# Patient Record
Sex: Female | Born: 1985 | Race: White | Hispanic: No | Marital: Single | Smoking: Never smoker
Health system: Southern US, Community
[De-identification: ages and names within clinical notes are randomized; demographics above are authoritative.]

## PROBLEM LIST (undated history)

## (undated) ENCOUNTER — Inpatient Hospital Stay (HOSPITAL_COMMUNITY): Payer: Self-pay

## (undated) ENCOUNTER — Emergency Department (HOSPITAL_COMMUNITY): Discharge status: Absent without leave | Payer: Self-pay

## (undated) DIAGNOSIS — Z789 Other specified health status: Secondary | ICD-10-CM

## (undated) HISTORY — PX: INDUCED ABORTION: SHX677

## (undated) HISTORY — PX: DILATION AND CURETTAGE OF UTERUS: SHX78

---
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
## 2006-03-15 ENCOUNTER — Emergency Department (HOSPITAL_COMMUNITY): Admission: EM | Admit: 2006-03-15 | Discharge: 2006-03-15 | Payer: Self-pay | Admitting: Emergency Medicine

## 2006-11-04 ENCOUNTER — Emergency Department (HOSPITAL_COMMUNITY): Admission: EM | Admit: 2006-11-04 | Discharge: 2006-11-04 | Payer: Self-pay | Admitting: Family Medicine

## 2006-11-22 ENCOUNTER — Emergency Department (HOSPITAL_COMMUNITY): Admission: EM | Admit: 2006-11-22 | Discharge: 2006-11-22 | Payer: Self-pay | Admitting: Family Medicine

## 2007-01-08 ENCOUNTER — Inpatient Hospital Stay (HOSPITAL_COMMUNITY): Admission: AD | Admit: 2007-01-08 | Discharge: 2007-01-08 | Payer: Self-pay | Admitting: Obstetrics & Gynecology

## 2007-01-12 ENCOUNTER — Inpatient Hospital Stay (HOSPITAL_COMMUNITY): Admission: AD | Admit: 2007-01-12 | Discharge: 2007-01-12 | Payer: Self-pay | Admitting: Family Medicine

## 2007-01-14 ENCOUNTER — Inpatient Hospital Stay (HOSPITAL_COMMUNITY): Admission: AD | Admit: 2007-01-14 | Discharge: 2007-01-14 | Payer: Self-pay | Admitting: Obstetrics & Gynecology

## 2007-08-09 ENCOUNTER — Emergency Department (HOSPITAL_COMMUNITY): Admission: EM | Admit: 2007-08-09 | Discharge: 2007-08-09 | Payer: Self-pay | Admitting: Emergency Medicine

## 2007-08-27 ENCOUNTER — Emergency Department (HOSPITAL_COMMUNITY): Admission: EM | Admit: 2007-08-27 | Discharge: 2007-08-27 | Payer: Self-pay | Admitting: Family Medicine

## 2008-01-07 ENCOUNTER — Emergency Department (HOSPITAL_COMMUNITY): Admission: EM | Admit: 2008-01-07 | Discharge: 2008-01-07 | Payer: Self-pay | Admitting: Emergency Medicine

## 2008-03-27 ENCOUNTER — Emergency Department (HOSPITAL_COMMUNITY): Admission: EM | Admit: 2008-03-27 | Discharge: 2008-03-27 | Payer: Self-pay | Admitting: Emergency Medicine

## 2008-04-01 ENCOUNTER — Emergency Department (HOSPITAL_COMMUNITY): Admission: EM | Admit: 2008-04-01 | Discharge: 2008-04-01 | Payer: Self-pay | Admitting: Family Medicine

## 2008-04-17 ENCOUNTER — Emergency Department (HOSPITAL_COMMUNITY): Admission: EM | Admit: 2008-04-17 | Discharge: 2008-04-17 | Payer: Self-pay | Admitting: Emergency Medicine

## 2008-05-20 ENCOUNTER — Inpatient Hospital Stay (HOSPITAL_COMMUNITY): Admission: AD | Admit: 2008-05-20 | Discharge: 2008-05-21 | Payer: Self-pay | Admitting: Obstetrics & Gynecology

## 2008-08-27 ENCOUNTER — Emergency Department (HOSPITAL_COMMUNITY): Admission: EM | Admit: 2008-08-27 | Discharge: 2008-08-27 | Payer: Self-pay | Admitting: Family Medicine

## 2008-10-20 ENCOUNTER — Emergency Department (HOSPITAL_COMMUNITY): Admission: EM | Admit: 2008-10-20 | Discharge: 2008-10-20 | Payer: Self-pay | Admitting: Family Medicine

## 2008-11-13 ENCOUNTER — Inpatient Hospital Stay (HOSPITAL_COMMUNITY): Admission: AD | Admit: 2008-11-13 | Discharge: 2008-11-13 | Payer: Self-pay | Admitting: Obstetrics & Gynecology

## 2009-06-15 ENCOUNTER — Emergency Department (HOSPITAL_COMMUNITY): Admission: EM | Admit: 2009-06-15 | Discharge: 2009-06-15 | Payer: Self-pay | Admitting: Family Medicine

## 2009-11-19 ENCOUNTER — Emergency Department (HOSPITAL_COMMUNITY): Admission: EM | Admit: 2009-11-19 | Discharge: 2009-11-19 | Payer: Self-pay | Admitting: Family Medicine

## 2010-01-08 ENCOUNTER — Inpatient Hospital Stay (HOSPITAL_COMMUNITY): Admission: AD | Admit: 2010-01-08 | Discharge: 2010-01-08 | Payer: Self-pay | Admitting: Obstetrics and Gynecology

## 2010-01-14 ENCOUNTER — Inpatient Hospital Stay (HOSPITAL_COMMUNITY): Admission: AD | Admit: 2010-01-14 | Discharge: 2010-01-14 | Payer: Self-pay | Admitting: Obstetrics & Gynecology

## 2010-01-31 ENCOUNTER — Inpatient Hospital Stay (HOSPITAL_COMMUNITY): Admission: AD | Admit: 2010-01-31 | Discharge: 2010-01-31 | Payer: Self-pay | Admitting: Obstetrics and Gynecology

## 2010-02-03 ENCOUNTER — Ambulatory Visit: Payer: Self-pay | Admitting: Obstetrics and Gynecology

## 2010-02-03 ENCOUNTER — Inpatient Hospital Stay (HOSPITAL_COMMUNITY): Admission: AD | Admit: 2010-02-03 | Discharge: 2010-02-03 | Payer: Self-pay | Admitting: Obstetrics & Gynecology

## 2010-08-16 ENCOUNTER — Emergency Department (HOSPITAL_COMMUNITY): Admission: EM | Admit: 2010-08-16 | Discharge: 2010-08-16 | Payer: Self-pay | Admitting: Emergency Medicine

## 2010-11-12 ENCOUNTER — Inpatient Hospital Stay (HOSPITAL_COMMUNITY): Admission: AD | Admit: 2010-11-12 | Discharge: 2010-02-01 | Payer: Self-pay | Admitting: Obstetrics and Gynecology

## 2010-11-22 ENCOUNTER — Emergency Department (HOSPITAL_COMMUNITY)
Admission: EM | Admit: 2010-11-22 | Discharge: 2010-11-22 | Payer: Self-pay | Source: Home / Self Care | Admitting: Emergency Medicine

## 2010-12-21 ENCOUNTER — Emergency Department (HOSPITAL_COMMUNITY)
Admission: EM | Admit: 2010-12-21 | Discharge: 2010-12-21 | Payer: Self-pay | Source: Home / Self Care | Admitting: Emergency Medicine

## 2010-12-23 LAB — WET PREP, GENITAL
Trich, Wet Prep: NONE SEEN
Yeast Wet Prep HPF POC: NONE SEEN

## 2010-12-23 LAB — POCT URINALYSIS DIPSTICK
Bilirubin Urine: NEGATIVE
Hgb urine dipstick: NEGATIVE
Ketones, ur: NEGATIVE mg/dL
Nitrite: NEGATIVE
Protein, ur: NEGATIVE mg/dL
Specific Gravity, Urine: 1.015 (ref 1.005–1.030)
Urine Glucose, Fasting: NEGATIVE mg/dL
Urobilinogen, UA: 0.2 mg/dL (ref 0.0–1.0)
pH: 7 (ref 5.0–8.0)

## 2010-12-23 LAB — RPR: RPR Ser Ql: NONREACTIVE

## 2010-12-23 LAB — GC/CHLAMYDIA PROBE AMP, GENITAL
Chlamydia, DNA Probe: NEGATIVE
GC Probe Amp, Genital: NEGATIVE

## 2010-12-23 LAB — HIV ANTIBODY (ROUTINE TESTING W REFLEX): HIV: NONREACTIVE

## 2010-12-23 LAB — POCT PREGNANCY, URINE: Preg Test, Ur: NEGATIVE

## 2011-02-15 LAB — POCT URINALYSIS DIPSTICK
Bilirubin Urine: NEGATIVE
Glucose, UA: NEGATIVE mg/dL
Ketones, ur: NEGATIVE mg/dL
Nitrite: NEGATIVE
Protein, ur: NEGATIVE mg/dL
Specific Gravity, Urine: 1.015 (ref 1.005–1.030)
Urobilinogen, UA: 0.2 mg/dL (ref 0.0–1.0)
pH: 6 (ref 5.0–8.0)

## 2011-02-15 LAB — WET PREP, GENITAL
Trich, Wet Prep: NONE SEEN
Yeast Wet Prep HPF POC: NONE SEEN

## 2011-02-15 LAB — GC/CHLAMYDIA PROBE AMP, GENITAL
Chlamydia, DNA Probe: POSITIVE — AB
GC Probe Amp, Genital: POSITIVE — AB

## 2011-02-15 LAB — POCT PREGNANCY, URINE: Preg Test, Ur: NEGATIVE

## 2011-02-18 LAB — POCT RAPID STREP A (OFFICE): Streptococcus, Group A Screen (Direct): NEGATIVE

## 2011-02-24 LAB — URINALYSIS, ROUTINE W REFLEX MICROSCOPIC
Bilirubin Urine: NEGATIVE
Bilirubin Urine: NEGATIVE
Glucose, UA: NEGATIVE mg/dL
Glucose, UA: NEGATIVE mg/dL
Hgb urine dipstick: NEGATIVE
Hgb urine dipstick: NEGATIVE
Hgb urine dipstick: NEGATIVE
Ketones, ur: NEGATIVE mg/dL
Ketones, ur: NEGATIVE mg/dL
Leukocytes, UA: NEGATIVE
Nitrite: NEGATIVE
Nitrite: NEGATIVE
Protein, ur: 100 mg/dL — AB
Protein, ur: NEGATIVE mg/dL
Protein, ur: NEGATIVE mg/dL
Specific Gravity, Urine: 1.025 (ref 1.005–1.030)
Urobilinogen, UA: 0.2 mg/dL (ref 0.0–1.0)
Urobilinogen, UA: 0.2 mg/dL (ref 0.0–1.0)
pH: 6.5 (ref 5.0–8.0)

## 2011-02-24 LAB — URINE MICROSCOPIC-ADD ON

## 2011-02-24 LAB — POCT PREGNANCY, URINE: Preg Test, Ur: POSITIVE

## 2011-02-24 LAB — WET PREP, GENITAL
Clue Cells Wet Prep HPF POC: NONE SEEN
Trich, Wet Prep: NONE SEEN
Trich, Wet Prep: NONE SEEN
Trich, Wet Prep: NONE SEEN
Yeast Wet Prep HPF POC: NONE SEEN
Yeast Wet Prep HPF POC: NONE SEEN

## 2011-02-24 LAB — GC/CHLAMYDIA PROBE AMP, GENITAL
Chlamydia, DNA Probe: NEGATIVE
Chlamydia, DNA Probe: POSITIVE — AB
GC Probe Amp, Genital: NEGATIVE
GC Probe Amp, Genital: NEGATIVE
GC Probe Amp, Genital: NEGATIVE

## 2011-03-09 LAB — WET PREP, GENITAL: Yeast Wet Prep HPF POC: NONE SEEN

## 2011-03-09 LAB — GC/CHLAMYDIA PROBE AMP, GENITAL
Chlamydia, DNA Probe: POSITIVE — AB
GC Probe Amp, Genital: NEGATIVE

## 2011-03-09 LAB — POCT URINALYSIS DIP (DEVICE)
Glucose, UA: NEGATIVE mg/dL
Hgb urine dipstick: NEGATIVE
Nitrite: NEGATIVE
Urobilinogen, UA: 0.2 mg/dL (ref 0.0–1.0)

## 2011-03-14 LAB — POCT URINALYSIS DIP (DEVICE)
Bilirubin Urine: NEGATIVE
Ketones, ur: NEGATIVE mg/dL

## 2011-03-14 LAB — URINE CULTURE

## 2011-03-14 LAB — POCT PREGNANCY, URINE: Preg Test, Ur: NEGATIVE

## 2011-03-31 ENCOUNTER — Inpatient Hospital Stay (HOSPITAL_COMMUNITY)
Admission: AD | Admit: 2011-03-31 | Discharge: 2011-03-31 | Disposition: A | Discharge status: Home / Self Care | Payer: Self-pay | Source: Ambulatory Visit | Attending: Obstetrics & Gynecology | Admitting: Obstetrics & Gynecology

## 2011-03-31 DIAGNOSIS — N949 Unspecified condition associated with female genital organs and menstrual cycle: Secondary | ICD-10-CM | POA: Insufficient documentation

## 2011-03-31 DIAGNOSIS — R109 Unspecified abdominal pain: Secondary | ICD-10-CM | POA: Insufficient documentation

## 2011-03-31 LAB — URINALYSIS, ROUTINE W REFLEX MICROSCOPIC
Glucose, UA: NEGATIVE mg/dL
Specific Gravity, Urine: >1.03 — ABNORMAL HIGH (ref 1.005–1.030)

## 2011-03-31 LAB — URINE MICROSCOPIC-ADD ON

## 2011-03-31 LAB — POCT PREGNANCY, URINE: Preg Test, Ur: NEGATIVE

## 2011-04-23 NOTE — H&P (Signed)
NAME:  Madison [name], Madison [name]                       ACCOUNT NO.:  [id number]   MEDICAL RECORD NO.:  [id number]                   PATIENT TYPE:  AMB   LOCATION:  SDC                                  FACILITY:  [facility]   PHYSICIAN:  [place] B. Earlene Plater, M.D.               DATE OF BIRTH:  1986/02/22   DATE OF ADMISSION:  01/20/2004  DATE OF DISCHARGE:                                HISTORY & PHYSICAL   PREOPERATIVE DIAGNOSIS:  Possible molar pregnancy.   INTENDED PROCEDURE:  Suction curettage with ultrasound guidance.   HISTORY OF PRESENT ILLNESS:  A 25 year old white female who initially  presented to Urgent Care with clotting, nausea, and vomiting.  Was found to  have a positive pregnancy test and a quantitative HCG of approximately  79,000.  Had an ultrasound at [facility] on January 15, 2004,  which showed a markedly thickened endometrium but no IUP or evidence of  ectopic.  Repeat HCG approximately one day later at the referring  physician's office showed no change.  The patient presented to my office  approximately four days later, and a recheck HCG was unchanged, still in the  79,000 range.  Given this persistently high HCG with only a thickened  endometrium, suspicion is high for a molar pregnancy.  The patient presents  for suction curettage.   PAST MEDICAL HISTORY:  None.   PAST SURGICAL HISTORY:  None.   MEDICATIONS:  Phenergan.   ALLERGIES:  None.   FAMILY HISTORY:  Noncontributory.   REVIEW OF SYSTEMS:  Otherwise negative.   PHYSICAL EXAMINATION:  VITAL SIGNS:  Blood pressure 110/70, weight 115,  height 5 feet 5 inches.  GENERAL:  Alert and oriented, in no acute distress.  SKIN: Warm and dry, no lesions.  CARDIAC:  Regular rate and rhythm.  CHEST:  Lungs clear to auscultation.  ABDOMEN:  Liver and spleen normal, no hernia.  PELVIC:  Normal external genitalia.  Vagina and cervix normal.  Uterus is  normal size to slightly enlarged and nontender.  No adnexal  masses or  tenderness noted.   ASSESSMENT:  Probable molar or partial molar pregnancy.   PLAN:  Suction curettage.  Operative risks discussed, including infection,  bleeding, uterine perforation, damage to surrounding organs.  Will use  ultrasound guidance to improve complete evacuation and reduce perforation  risk.  All questions answered.  Patient wishes to proceed.                                               Gerri Spore B. Earlene Plater, M.D.    WBD/MEDQ  D:  01/20/2004  T:  01/20/2004  Job:  [id number]   cc:   Brett Canales A. Cleta Alberts, M.D.  [address]  [place]  [place] [address]  Fax: [phone]

## 2011-04-23 NOTE — Op Note (Signed)
NAME:  Madison [name], Madison [name]                       ACCOUNT NO.:  [id number]   MEDICAL RECORD NO.:  [id number]                   PATIENT TYPE:  AMB   LOCATION:  SDC                                  [facility]   PHYSICIAN:  [place] B. Earlene Plater, M.D.               DATE OF BIRTH:  10-03-1986   DATE OF PROCEDURE:  01/20/2004  DATE OF DISCHARGE:                                 OPERATIVE REPORT   PREOPERATIVE DIAGNOSIS:  Missed abortion questionable molar pregnancy.   POSTOPERATIVE DIAGNOSIS:  Missed abortion questionable molar pregnancy.   PROCEDURE:  Suction curettage with ultrasound assistance.   SURGEON:  Chester Holstein. Earlene Plater, M.D.   ANESTHESIA:  MAC and 20 mL of 0.25% Nesacaine paracervical block.   ESTIMATED BLOOD LOSS:  Less than 50 mL.   COMPLICATIONS:  None.   INDICATIONS FOR PROCEDURE:  Patient with a persistently elevated HCG in the  80,000 range with heterogeneously thickened endometrium, no adnexal masses.   DESCRIPTION OF PROCEDURE:  The patient was taken to the operating room and  MAC obtained.  She was placed in the [place] stirrups and prepped and draped  in the standard fashion.  Her bladder was emptied with a red rubber  catheter.  The speculum was inserted.  Paracervical block was placed in a  standard fashion.  A single tooth was placed on the anterior lip of the  cervix and the cervix easily dilated to a #21.  A #7 curved cannula was  inserted and with suction on, a moderate amount of products returned.  It  took 4-5 passes to completely clear the uterus.  The uterus was gently  curetted and a gritty texture noted.  There was not evidence of any retained  tissue by ultrasound.  Therefore, the procedure was terminated.  The  speculum was removed, the cervix was hemostatic.  The patient was taken to  the recovery room awake, alert, and in stable condition.  There were no  complications and she tolerated the procedure well.     Gerri Spore B. Earlene Plater, M.D.    WBD/MEDQ  D:  01/20/2004  T:  01/20/2004  Job:  [id number]

## 2011-04-28 ENCOUNTER — Inpatient Hospital Stay (INDEPENDENT_AMBULATORY_CARE_PROVIDER_SITE_OTHER)
Admission: RE | Admit: 2011-04-28 | Discharge: 2011-04-28 | Disposition: A | Discharge status: Home / Self Care | Payer: Self-pay | Source: Ambulatory Visit | Attending: Family Medicine | Admitting: Family Medicine

## 2011-04-28 DIAGNOSIS — N390 Urinary tract infection, site not specified: Secondary | ICD-10-CM

## 2011-04-28 LAB — POCT URINALYSIS DIP (DEVICE)
Protein, ur: NEGATIVE mg/dL
Specific Gravity, Urine: <=1.005 (ref 1.005–1.030)
Urobilinogen, UA: 0.2 mg/dL (ref 0.0–1.0)

## 2011-04-28 LAB — WET PREP, GENITAL
Trich, Wet Prep: NONE SEEN
Yeast Wet Prep HPF POC: NONE SEEN

## 2011-04-28 LAB — POCT PREGNANCY, URINE: Preg Test, Ur: NEGATIVE

## 2011-04-29 LAB — URINE CULTURE
Colony Count: NO GROWTH
Culture  Setup Time: 201205231720
Culture: NO GROWTH

## 2011-04-30 LAB — GC/CHLAMYDIA PROBE AMP, GENITAL
Chlamydia, DNA Probe: POSITIVE — AB
GC Probe Amp, Genital: NEGATIVE

## 2011-07-01 ENCOUNTER — Encounter (HOSPITAL_COMMUNITY): Payer: Self-pay | Admitting: *Deleted

## 2011-07-01 ENCOUNTER — Inpatient Hospital Stay (HOSPITAL_COMMUNITY)
Admission: AD | Admit: 2011-07-01 | Discharge: 2011-07-01 | Disposition: A | Discharge status: Home / Self Care | Payer: Self-pay | Source: Ambulatory Visit | Attending: Obstetrics & Gynecology | Admitting: Obstetrics & Gynecology

## 2011-07-01 DIAGNOSIS — A499 Bacterial infection, unspecified: Secondary | ICD-10-CM

## 2011-07-01 DIAGNOSIS — B9689 Other specified bacterial agents as the cause of diseases classified elsewhere: Secondary | ICD-10-CM | POA: Insufficient documentation

## 2011-07-01 DIAGNOSIS — N898 Other specified noninflammatory disorders of vagina: Secondary | ICD-10-CM

## 2011-07-01 DIAGNOSIS — N949 Unspecified condition associated with female genital organs and menstrual cycle: Secondary | ICD-10-CM | POA: Insufficient documentation

## 2011-07-01 DIAGNOSIS — N938 Other specified abnormal uterine and vaginal bleeding: Secondary | ICD-10-CM | POA: Insufficient documentation

## 2011-07-01 DIAGNOSIS — N939 Abnormal uterine and vaginal bleeding, unspecified: Secondary | ICD-10-CM

## 2011-07-01 DIAGNOSIS — R109 Unspecified abdominal pain: Secondary | ICD-10-CM | POA: Insufficient documentation

## 2011-07-01 DIAGNOSIS — N760 Acute vaginitis: Secondary | ICD-10-CM | POA: Insufficient documentation

## 2011-07-01 HISTORY — DX: Other specified health status: Z78.9

## 2011-07-01 LAB — URINALYSIS, ROUTINE W REFLEX MICROSCOPIC
Bilirubin Urine: NEGATIVE
Glucose, UA: NEGATIVE mg/dL
Specific Gravity, Urine: >1.03 — ABNORMAL HIGH (ref 1.005–1.030)
Urobilinogen, UA: 0.2 mg/dL (ref 0.0–1.0)

## 2011-07-01 LAB — URINE MICROSCOPIC-ADD ON

## 2011-07-01 LAB — CBC
MCH: 32.8 pg (ref 26.0–34.0)
Platelets: 221 10*3/uL (ref 150–400)
RBC: 4.42 MIL/uL (ref 3.87–5.11)

## 2011-07-01 LAB — DIFFERENTIAL
Basophils Relative: 1 % (ref 0–1)
Eosinophils Absolute: 0.1 10*3/uL (ref 0.0–0.7)
Lymphs Abs: 1.3 10*3/uL (ref 0.7–4.0)
Neutro Abs: 1.5 10*3/uL — ABNORMAL LOW (ref 1.7–7.7)
Neutrophils Relative %: 47 % (ref 43–77)

## 2011-07-01 LAB — WET PREP, GENITAL: Trich, Wet Prep: NONE SEEN

## 2011-07-01 MED ORDER — METRONIDAZOLE 500 MG PO TABS: 500.0000 mg | ORAL_TABLET | Freq: Three times a day (TID) | ORAL | Status: AC

## 2011-07-01 NOTE — ED Notes (Signed)
Pt left without informing staff, called pt's phone # to inform her a prescription was sent to her pharmacy & that she needs to return for BV instructions.  Pt did not answer, attempted to leave voicemail but mailbox was full.

## 2011-07-01 NOTE — ED Provider Notes (Signed)
History    Madison [name] presents with vaginal discharge and abdominal cramping that started a week ago. Recently has a new sex partner, last sexual intercourse was yesterday. Never had a pap smear. Hx of Chlamydia with last sex partner. Uses condoms for birth control sometimes. Discharge is described as pink to brown. LMP 06/23/11 and was normal.   Chief Complaint  Patient presents with  . Abdominal Cramping   Patient is a 25 y.o. female presenting with cramps. The history is provided by the patient.  Abdominal Cramping The primary symptoms of the illness include abdominal pain, nausea and vaginal discharge. The primary symptoms of the illness do not include fever, fatigue, vomiting, diarrhea or dysuria. The onset of the illness was gradual. The problem has been gradually worsening.  The vaginal discharge is not associated with dysuria.  Symptoms associated with the illness do not include chills, diaphoresis, constipation, frequency or back pain.    Past Medical History  Diagnosis Date  . No pertinent past medical history     Past Surgical History  Procedure Date  . Dilation and curettage of uterus     No family history on file.  History  Substance Use Topics  . Smoking status: Never Smoker   . Smokeless tobacco: Not on file  . Alcohol Use: Yes     on weekends    OB History    Grav Para Term Preterm Abortions TAB SAB Ect Mult Living   3    3  3          Review of Systems  Constitutional: Negative for fever, chills, diaphoresis and fatigue.  HENT: Negative for ear pain, congestion, sore throat, facial swelling, neck pain, neck stiffness, dental problem and sinus pressure.   Eyes: Negative for photophobia, pain and discharge.  Respiratory: Negative for cough, chest tightness and wheezing.   Cardiovascular: Negative.   Gastrointestinal: Positive for nausea and abdominal pain. Negative for vomiting, diarrhea, constipation and abdominal distention.  Genitourinary:  Positive for vaginal discharge. Negative for dysuria, frequency, flank pain and difficulty urinating.  Musculoskeletal: Negative for myalgias, back pain and gait problem.  Skin: Negative.  Negative for color change and rash.  Neurological: Negative for dizziness, speech difficulty, weakness, light-headedness, numbness and headaches.  Psychiatric/Behavioral: Negative for confusion and agitation.    Physical Exam  BP 104/66  Pulse 69  Temp(Src) 98.7 F (37.1 C) (Oral)  Resp 16  Ht 5' 6.5" (1.689 m)  Wt 141 lb 12.8 oz (64.32 kg)  BMI 22.54 kg/m2  SpO2 99%  LMP 06/12/2011  Physical Exam  Nursing note and vitals reviewed. Constitutional: She is oriented to person, place, and time. She appears well-developed and well-nourished.  HENT:  Head: Normocephalic and atraumatic.  Eyes: EOM are normal.  Neck: Neck supple.  Pulmonary/Chest: Effort normal.  Abdominal: Soft. There is no tenderness.  Genitourinary: Uterus normal. There is no lesion on the right labia. There is no lesion on the left labia. Cervix exhibits motion tenderness. Right adnexum displays no tenderness. Left adnexum displays no tenderness. Vaginal discharge found.       Cervical motion is mild. Small amount of blood vaginal vault that is malodorous.   Musculoskeletal: Normal range of motion. She exhibits no edema.  Neurological: She is alert and oriented to person, place, and time. No cranial nerve deficit.  Skin: Skin is warm and dry.    ED Course  Procedures Cultures for GC and Chlamydia obtained. CBC with diff ordered.   MDM     .  [place], [place] 07/08/11 [phone]

## 2011-07-01 NOTE — Progress Notes (Signed)
By H Neese NP 

## 2011-07-01 NOTE — Progress Notes (Signed)
Pt states she has been having abdominal cramping for about one week. Has been having a brown d/c with an odor.

## 2011-07-01 NOTE — Progress Notes (Signed)
Lower abd cramping x 1 week, brown discharge also x 1 week, now notices odor.

## 2011-07-01 NOTE — ED Notes (Signed)
Pt returned for DC instructions.

## 2011-07-01 NOTE — ED Notes (Signed)
Pt called back, informed pt a prescription was sent to her pharmacy & that she needs to return for instructions.  Pt states she had to leave for an interview @ 1400 & that she will return after the interview.

## 2011-07-02 LAB — GC/CHLAMYDIA PROBE AMP, GENITAL: GC Probe Amp, Genital: NEGATIVE

## 2011-07-04 NOTE — ED Notes (Signed)
Chlamydia positive. Called pt to give result, no answer, left message to call back.

## 2011-07-05 ENCOUNTER — Telehealth (HOSPITAL_COMMUNITY): Payer: Self-pay | Admitting: *Deleted

## 2011-07-05 NOTE — Telephone Encounter (Signed)
Patient called in to get her lab results.  Name, dob and social verified.  Instructed patient of positive chlamydia culture.  Instructed patient to schedule treatment with [facility] ST[facility] clinic at [phone].  Instructed patient to notify her partner for treatment.  Report faxed to health department.

## 2011-08-27 LAB — POCT URINALYSIS DIP (DEVICE)
Nitrite: NEGATIVE
Protein, ur: >=300 — AB
pH: 5.5

## 2011-08-27 LAB — POCT PREGNANCY, URINE
Operator id: 235561
Preg Test, Ur: NEGATIVE

## 2011-08-31 LAB — GC/CHLAMYDIA PROBE AMP, GENITAL
Chlamydia, DNA Probe: POSITIVE — AB
GC Probe Amp, Genital: NEGATIVE

## 2011-08-31 LAB — POCT URINALYSIS DIP (DEVICE)
Hgb urine dipstick: NEGATIVE
Protein, ur: >=300 — AB
Specific Gravity, Urine: 1.025
Urobilinogen, UA: 0.2
pH: 6

## 2011-09-02 LAB — URINALYSIS, ROUTINE W REFLEX MICROSCOPIC
Bilirubin Urine: NEGATIVE
Nitrite: NEGATIVE
Specific Gravity, Urine: >1.03 — ABNORMAL HIGH
Urobilinogen, UA: 0.2

## 2011-09-02 LAB — POCT PREGNANCY, URINE: Preg Test, Ur: NEGATIVE

## 2011-09-02 LAB — WET PREP, GENITAL: Trich, Wet Prep: NONE SEEN

## 2011-09-02 LAB — URINE MICROSCOPIC-ADD ON

## 2011-09-02 LAB — GC/CHLAMYDIA PROBE AMP, GENITAL: Chlamydia, DNA Probe: NEGATIVE

## 2011-09-06 LAB — URINE CULTURE: Colony Count: >=100000

## 2011-09-06 LAB — POCT URINALYSIS DIP (DEVICE)
Bilirubin Urine: NEGATIVE
Glucose, UA: NEGATIVE
Hgb urine dipstick: NEGATIVE
Ketones, ur: NEGATIVE
Nitrite: NEGATIVE
Operator id: 239701
Protein, ur: NEGATIVE
Specific Gravity, Urine: 1.01
Urobilinogen, UA: 1
pH: 6

## 2011-09-07 LAB — POCT URINALYSIS DIP (DEVICE)
Nitrite: NEGATIVE
Protein, ur: >=300 mg/dL — AB
Urobilinogen, UA: 0.2 mg/dL (ref 0.0–1.0)
pH: 5.5 (ref 5.0–8.0)

## 2011-09-07 LAB — POCT PREGNANCY, URINE: Preg Test, Ur: NEGATIVE

## 2011-09-07 LAB — WET PREP, GENITAL

## 2011-09-10 LAB — URINE MICROSCOPIC-ADD ON

## 2011-09-10 LAB — URINALYSIS, ROUTINE W REFLEX MICROSCOPIC
Glucose, UA: NEGATIVE mg/dL
Leukocytes, UA: NEGATIVE
Nitrite: POSITIVE — AB
pH: 6 (ref 5.0–8.0)

## 2011-09-10 LAB — GC/CHLAMYDIA PROBE AMP, GENITAL
Chlamydia, DNA Probe: NEGATIVE
GC Probe Amp, Genital: NEGATIVE

## 2011-09-10 LAB — WET PREP, GENITAL
Clue Cells Wet Prep HPF POC: NONE SEEN
Yeast Wet Prep HPF POC: NONE SEEN

## 2011-09-17 LAB — POCT URINALYSIS DIP (DEVICE)
Glucose, UA: NEGATIVE
Ketones, ur: NEGATIVE
Operator id: 239701
Specific Gravity, Urine: 1.025

## 2011-10-07 NOTE — ED Provider Notes (Signed)
Attestation of Attending Supervision of Advanced Practitioner: Evaluation and management procedures were performed by the PA/NP/CNM/OB Fellow under my supervision/collaboration. Chart reviewed and agree with management and plan.  ANYANWU,UGONNA A M.D. 10/07/2011 2:51 PM   

## 2011-10-08 ENCOUNTER — Inpatient Hospital Stay (INDEPENDENT_AMBULATORY_CARE_PROVIDER_SITE_OTHER)
Admission: RE | Admit: 2011-10-08 | Discharge: 2011-10-08 | Disposition: A | Discharge status: Home / Self Care | Payer: Self-pay | Source: Ambulatory Visit | Attending: Family Medicine | Admitting: Family Medicine

## 2011-10-08 DIAGNOSIS — N760 Acute vaginitis: Secondary | ICD-10-CM

## 2011-10-08 LAB — POCT URINALYSIS DIP (DEVICE)
Bilirubin Urine: NEGATIVE
Bilirubin Urine: NEGATIVE
Glucose, UA: NEGATIVE mg/dL
Glucose, UA: NEGATIVE mg/dL
Ketones, ur: NEGATIVE mg/dL
Specific Gravity, Urine: 1.025 (ref 1.005–1.030)
Specific Gravity, Urine: 1.02 (ref 1.005–1.030)
pH: 7 (ref 5.0–8.0)

## 2011-10-08 LAB — WET PREP, GENITAL: Trich, Wet Prep: NONE SEEN

## 2011-10-11 LAB — GC/CHLAMYDIA PROBE AMP, GENITAL
Chlamydia, DNA Probe: NEGATIVE
GC Probe Amp, Genital: POSITIVE — AB

## 2011-10-12 NOTE — ED Notes (Signed)
Pt. Called back and verified x2. Pt. Given results and told to come back for injection between 1130-1800.Pt. Told she was adeq. Treated with Flagyl for BV.  Pt. Instructed to notify her partner, no sex for 1 week and to practice safe sex. Pt. Told to get HIV testing at the [facility] STD clinic.

## 2011-10-12 NOTE — ED Notes (Signed)
GC pos., Chlamydia neg., wet prep: Many clue cells, few WBC's.  Pt. adequately treated with Metronidazole.  Labs shown to [facility] and order obtained for Rocephin 250 mg. IM.  I called pt. and left message to call.

## 2011-10-14 ENCOUNTER — Telehealth (HOSPITAL_COMMUNITY): Payer: Self-pay | Admitting: *Deleted

## 2011-10-19 ENCOUNTER — Telehealth (HOSPITAL_COMMUNITY): Payer: Self-pay | Admitting: *Deleted

## 2011-10-19 NOTE — ED Notes (Addendum)
11/11 Sheffield Slider RN documented on Pending lab results F/U form that pt. came in to get a work note to cover her from 11/2 visit. She explained that she needs to get the injection. Pt. left.

## 2011-10-20 ENCOUNTER — Telehealth (HOSPITAL_COMMUNITY): Payer: Self-pay | Admitting: *Deleted

## 2011-10-22 ENCOUNTER — Telehealth (HOSPITAL_COMMUNITY): Payer: Self-pay | Admitting: *Deleted

## 2011-11-02 ENCOUNTER — Telehealth (HOSPITAL_COMMUNITY): Payer: Self-pay | Admitting: *Deleted

## 2011-11-05 ENCOUNTER — Encounter (HOSPITAL_COMMUNITY): Payer: Self-pay | Admitting: *Deleted

## 2011-11-05 ENCOUNTER — Emergency Department (HOSPITAL_COMMUNITY)
Admission: EM | Admit: 2011-11-05 | Discharge: 2011-11-05 | Disposition: A | Discharge status: Home / Self Care | Payer: Self-pay | Source: Home / Self Care

## 2011-11-05 ENCOUNTER — Telehealth (HOSPITAL_COMMUNITY): Payer: Self-pay | Admitting: *Deleted

## 2011-11-05 MED ORDER — CEFTRIAXONE SODIUM 250 MG IJ SOLR
250.0000 mg | Freq: Once | INTRAMUSCULAR | Status: AC
  Administered 2011-11-05: 250 mg via INTRAMUSCULAR

## 2011-11-05 MED ORDER — CEFTRIAXONE SODIUM 250 MG IJ SOLR
INTRAMUSCULAR | Status: AC
  Filled 2011-11-05: qty 250

## 2011-11-05 MED ORDER — LIDOCAINE HCL (PF) 1 % IJ SOLN
INTRAMUSCULAR | Status: AC
  Filled 2011-11-05: qty 5

## 2011-11-05 NOTE — ED Notes (Signed)
Pt. here for Rocephin injection for GC diagnosed 10/12/2011

## 2011-11-05 NOTE — ED Notes (Signed)
DHHS form completed showing pt. has been treated and faxed to the [facility].  I called Brandi Sessoms at the [facility] and left a message that pt. came today for treatment. Vassie Moselle 11/05/2011

## 2011-12-29 ENCOUNTER — Encounter (HOSPITAL_COMMUNITY): Payer: Self-pay

## 2011-12-29 ENCOUNTER — Emergency Department (INDEPENDENT_AMBULATORY_CARE_PROVIDER_SITE_OTHER)
Admission: EM | Admit: 2011-12-29 | Discharge: 2011-12-29 | Disposition: A | Discharge status: Home / Self Care | Payer: Self-pay | Source: Home / Self Care

## 2011-12-29 DIAGNOSIS — Z711 Person with feared health complaint in whom no diagnosis is made: Secondary | ICD-10-CM

## 2011-12-29 LAB — POCT URINALYSIS DIP (DEVICE)
Leukocytes, UA: NEGATIVE
Nitrite: NEGATIVE
Protein, ur: 30 mg/dL — AB
Specific Gravity, Urine: 1.025 (ref 1.005–1.030)
Urobilinogen, UA: 2 mg/dL — ABNORMAL HIGH (ref 0.0–1.0)

## 2011-12-29 LAB — WET PREP, GENITAL

## 2011-12-29 MED ORDER — CEFTRIAXONE SODIUM 250 MG IJ SOLR
INTRAMUSCULAR | Status: AC
  Filled 2011-12-29: qty 250

## 2011-12-29 MED ORDER — LIDOCAINE HCL (PF) 1 % IJ SOLN
INTRAMUSCULAR | Status: AC
  Filled 2011-12-29: qty 5

## 2011-12-29 MED ORDER — CEFTRIAXONE SODIUM 250 MG IJ SOLR
250.0000 mg | Freq: Once | INTRAMUSCULAR | Status: AC
  Administered 2011-12-29: 250 mg via INTRAMUSCULAR

## 2011-12-29 MED ORDER — AZITHROMYCIN 250 MG PO TABS
1000.0000 mg | ORAL_TABLET | Freq: Every day | ORAL | Status: DC
  Administered 2011-12-29: 1000 mg via ORAL

## 2011-12-29 MED ORDER — AZITHROMYCIN 250 MG PO TABS
ORAL_TABLET | ORAL | Status: AC
  Filled 2011-12-29: qty 4

## 2011-12-29 NOTE — ED Provider Notes (Signed)
Medical screening examination/treatment/procedure(s) were performed by non-physician practitioner and as supervising physician I was immediately available for consultation/collaboration.  Lynsie Mcwatters M. MD   Dailey Buccheri M Shekela Goodridge, MD 12/29/11 1812 

## 2011-12-29 NOTE — ED Provider Notes (Signed)
History     CSN: [id number]  Arrival date & time 12/29/11  1110   None     Chief Complaint  Patient presents with  . SEXUALLY TRANSMITTED DISEASE    (Consider location/radiation/quality/duration/timing/severity/associated sxs/prior treatment) HPI Comments: Patient presents today requesting he be treated for gonorrhea. She states her boyfriend was recently tested positive for gonorrhea. She's had a brownish discharge and odor for the last 3 weeks. She admits to having irregular menses and her last normal menstrual period was 2 months ago. She states that she was treated for gonorrhea 2 weeks ago [facility]. She was treated with intramuscular injection and pills but she does not know what these were. Her boyfriend was not treated at the same time as she was. He is getting treated today also.    Past Medical History  Diagnosis Date  . No pertinent past medical history     Past Surgical History  Procedure Date  . Dilation and curettage of uterus     Family History  Problem Relation Age of Onset  . Parkinsonism Other   . Diabetes Other   . Schizophrenia Other     History  Substance Use Topics  . Smoking status: Never Smoker   . Smokeless tobacco: Not on file  . Alcohol Use: Yes     on weekends    OB History    Grav Para Term Preterm Abortions TAB SAB Ect Mult Living   3    3  3          Review of Systems  Gastrointestinal: Negative for nausea, vomiting and abdominal pain.  Genitourinary: Positive for vaginal discharge. Negative for dysuria, frequency, vaginal bleeding, vaginal pain and pelvic pain.    Allergies  Review of patient's allergies indicates no known allergies.  Home Medications  No current outpatient prescriptions on file.  There were no vitals taken for this visit.  Physical Exam  Nursing note and vitals reviewed. Constitutional: She appears well-developed and well-nourished. No distress.  Cardiovascular: Normal rate, regular rhythm  and normal heart sounds.   Pulmonary/Chest: Effort normal and breath sounds normal. No respiratory distress.  Abdominal: Soft. She exhibits no distension and no mass. There is no tenderness.  Genitourinary: Uterus normal. There is no rash, tenderness or lesion on the right labia. There is no rash, tenderness or lesion on the left labia. Cervix exhibits no motion tenderness, no discharge and no friability. Right adnexum displays no mass, no tenderness and no fullness. Left adnexum displays no mass, no tenderness and no fullness. There is bleeding (small amount of brownish spotting noted) around the vagina. No erythema or tenderness around the vagina. No foreign body around the vagina. No signs of injury around the vagina. No vaginal discharge found.  Neurological: She is alert.  Skin: Skin is warm and dry.  Psychiatric: She has a normal mood and affect.    ED Course  Procedures (including critical care time)  Labs Reviewed  POCT URINALYSIS DIP (DEVICE) - Abnormal; Notable for the following:    Hgb urine dipstick MODERATE (*)    Protein, ur 30 (*)    Urobilinogen, UA 2.0 (*)    All other components within normal limits  POCT PREGNANCY, URINE  GC/CHLAMYDIA PROBE AMP, GENITAL  WET PREP, GENITAL  POCT PREGNANCY, URINE   No results found.   No diagnosis found.    MDM  GC/Chlamydia & Wet prep pending. Urine preg neg.        Melody Comas,  PA 12/29/11 1230

## 2011-12-29 NOTE — ED Notes (Addendum)
States she has GC and needs treatment ; has been having syx for 2 weeks, prior tx w metronidazole; partner her for tx ; per pt, she [facility] for her syx f/u in November; LMP was brief, feels as if her menses is trying to come on

## 2011-12-30 LAB — GC/CHLAMYDIA PROBE AMP, GENITAL: GC Probe Amp, Genital: POSITIVE — AB

## 2011-12-31 ENCOUNTER — Telehealth (HOSPITAL_COMMUNITY): Payer: Self-pay | Admitting: *Deleted

## 2011-12-31 NOTE — ED Notes (Signed)
GC pos., Chlamydia neg. Wet prep: Few clue cells, mod. WBC's.  Pt. adequately treated with Rocephin and Zithromax. Pt. verified with DOB and SS #. Pt. unable to give correct address. i told her the address we have.  Pt. states its her grandmothers address she gives because she moves around a lot. Pt. given results and told she was adequately treated. Pt. instructed to notify her partner, no sex for 1 week and to practice safe sex.  Pt. told she can get HIV testing at the [facility].  Pt. voiced understanding. Vassie Moselle 12/31/2011

## 2011-12-31 NOTE — ED Notes (Signed)
DHHS form completed and faxed to [facility]. Madison [name] 12/31/2011

## 2012-03-29 ENCOUNTER — Emergency Department (INDEPENDENT_AMBULATORY_CARE_PROVIDER_SITE_OTHER)
Admission: EM | Admit: 2012-03-29 | Discharge: 2012-03-29 | Disposition: A | Discharge status: Nursing Home (Includes ICF) | Payer: Self-pay | Source: Home / Self Care | Attending: Family Medicine | Admitting: Family Medicine

## 2012-03-29 ENCOUNTER — Inpatient Hospital Stay (HOSPITAL_COMMUNITY): Payer: Self-pay

## 2012-03-29 ENCOUNTER — Inpatient Hospital Stay (HOSPITAL_COMMUNITY)
Admission: AD | Admit: 2012-03-29 | Discharge: 2012-03-29 | Disposition: A | Discharge status: Home / Self Care | Payer: Self-pay | Source: Ambulatory Visit | Attending: Obstetrics and Gynecology | Admitting: Obstetrics and Gynecology

## 2012-03-29 ENCOUNTER — Encounter (HOSPITAL_COMMUNITY): Payer: Self-pay | Admitting: *Deleted

## 2012-03-29 DIAGNOSIS — O209 Hemorrhage in early pregnancy, unspecified: Secondary | ICD-10-CM | POA: Insufficient documentation

## 2012-03-29 DIAGNOSIS — R109 Unspecified abdominal pain: Secondary | ICD-10-CM | POA: Insufficient documentation

## 2012-03-29 DIAGNOSIS — Z3200 Encounter for pregnancy test, result unknown: Secondary | ICD-10-CM

## 2012-03-29 LAB — CBC
MCH: 32 pg (ref 26.0–34.0)
MCHC: 36.1 g/dL — ABNORMAL HIGH (ref 30.0–36.0)
Platelets: 222 10*3/uL (ref 150–400)

## 2012-03-29 LAB — POCT URINALYSIS DIP (DEVICE)
Ketones, ur: NEGATIVE mg/dL
Leukocytes, UA: NEGATIVE
Protein, ur: NEGATIVE mg/dL

## 2012-03-29 LAB — HCG, QUANTITATIVE, PREGNANCY: hCG, Beta Chain, Quant, S: 61 m[IU]/mL — ABNORMAL HIGH (ref <5)

## 2012-03-29 LAB — POCT PREGNANCY, URINE: Preg Test, Ur: POSITIVE — AB

## 2012-03-29 MED ORDER — ONDANSETRON HCL 4 MG/2ML IJ SOLN: 4.0000 mg | Freq: Once | INTRAMUSCULAR | Status: DC

## 2012-03-29 MED ORDER — PROMETHAZINE HCL 25 MG PO TABS: 25.0000 mg | ORAL_TABLET | Freq: Four times a day (QID) | ORAL | Status: DC | PRN

## 2012-03-29 NOTE — ED Notes (Signed)
Pt with c/o abdominal pain and vaginal discharge - vaginal discharge x one month - abdominal pain intermittently x 3 months -

## 2012-03-29 NOTE — ED Provider Notes (Signed)
History     CSN: [id number]  Arrival date & time 03/29/12  1726   First MD Initiated Contact with Patient 03/29/12 1739      Chief Complaint  Patient presents with  . Abdominal Pain  . Vaginal Discharge    (Consider location/radiation/quality/duration/timing/severity/associated sxs/prior treatment) HPI Comments: 26 year old female G3P03 with history of recurrent infection with gonorrhea and Chlamydia, last treated for gonorrhea 3 months ago. Comes complaining of diffuse abdominal pain states her pain has been on and off for the last 3 months and is concerned for gallstones. Pain has been worse in the last 2 days, also today associated with nausea no vomiting. Patient also reports vaginal discharge and wanted to be checked for STD, as she has been infected with gonorrhea and Chlamydia with her current boyfriend and is not using protection. Reports her last menstrual period was April 1 and she had abundant vaginal bleeding for 4 days. States that she checked a home pregnancy test 2 weeks ago and was negative. Denies fever or chills. No headache or dizziness.   Past Medical History  Diagnosis Date  . No pertinent past medical history     Past Surgical History  Procedure Date  . Dilation and curettage of uterus     Family History  Problem Relation Age of Onset  . Parkinsonism Other   . Diabetes Other   . Schizophrenia Other     History  Substance Use Topics  . Smoking status: Never Smoker   . Smokeless tobacco: Not on file  . Alcohol Use: Yes     on weekends    OB History    Grav Para Term Preterm Abortions TAB SAB Ect Mult Living   3    3  3          Review of Systems  Constitutional: Negative for fever and chills.  Gastrointestinal: Positive for nausea and abdominal pain. Negative for vomiting and diarrhea.  Genitourinary: Positive for vaginal discharge.  Neurological: Negative for dizziness and headaches.    Allergies  Review of patient's allergies indicates  no known allergies.  Home Medications  No current outpatient prescriptions on file.  BP 103/73  Pulse 99  Temp(Src) 98.5 F (36.9 C) (Oral)  Resp 20  SpO2 100%  LMP 03/06/2012  Physical Exam  Nursing note and vitals reviewed. Constitutional: She is oriented to person, place, and time. She appears well-developed and well-nourished. No distress.  HENT:  Head: Normocephalic and atraumatic.  Mouth/Throat: No oropharyngeal exudate.  Eyes: Conjunctivae are normal. No scleral icterus.  Neck: Neck supple.  Cardiovascular: Regular rhythm and normal heart sounds.   No murmur heard. Pulmonary/Chest: Breath sounds normal.  Abdominal: Soft. She exhibits no distension. There is no rebound and no guarding.       Right flank and right lower quadrant tenderness to deep palpation.   Genitourinary: Uterus is enlarged. Uterus is not tender. Cervix exhibits no motion tenderness. Right adnexum displays tenderness and fullness. Left adnexum displays no mass, no tenderness and no fullness. There is bleeding around the vagina.    Lymphadenopathy:    She has no cervical adenopathy.  Neurological: She is alert and oriented to person, place, and time.  Skin: No rash noted.    ED Course  Procedures (including critical care time)  Labs Reviewed  POCT URINALYSIS DIP (DEVICE) - Abnormal; Notable for the following:    Hgb urine dipstick SMALL (*)    All other components within normal limits  GC/CHLAMYDIA PROBE AMP, GENITAL  No results found.   1. Evaluation for a suspected ectopic pregnancy       MDM  26 year old female G3P03 with recurrent history of chlamydia and gonorrhea infection. Last treated for gonorrhea in January/2013. Reports last menstrual period in April 1. Here complaining of diffuse abdominal pain and nausea on exam mild cervical bleeding, right adnexa feels full and tender to palpation. Vital signs stable other than mild tachycardia. Concerning for ectopic pregnancy . Decided to  transfer to [facility] via [facility]. Patient had Zofran 4 mg IV prior to transfer.         Sharin Grave, MD 03/31/12 1151

## 2012-03-29 NOTE — MAU Provider Note (Signed)
History     CSN: [id number]  Arrival date and time: 03/29/12 2017   None     No chief complaint on file.  HPI Madison [name] is 26 y.o. [phone] sent by [facility] to MAU to evaluate patient for ? Ectopic pregnancy.  Dr. Hattie Perch reported she has right lower quadrant tenderness, vaginal bleeding and ? Fullness on exam,  hx of recurrent GC/CHL with last +GC in January 2013, 3 month history of pain.  + for nausea without vomiting. She also reported her Vital signs were stable and Urine pregnancy test is positive.  LMP 03/06/12.  She was transported with IV infusing and had been given Zofran 4mg  IV.    Past Medical History  Diagnosis Date  . No pertinent past medical history     Past Surgical History  Procedure Date  . Dilation and curettage of uterus     Family History  Problem Relation Age of Onset  . Parkinsonism Other   . Diabetes Other   . Schizophrenia Other     History  Substance Use Topics  . Smoking status: Never Smoker   . Smokeless tobacco: Not on file  . Alcohol Use: Yes     on weekends    Allergies: No Known Allergies  No prescriptions prior to admission    Review of Systems  Gastrointestinal: Positive for nausea and abdominal pain. Negative for vomiting.  Genitourinary:       Vaginal bleeding   Physical Exam   Blood pressure 118/71, pulse 107, temperature 98.9 F (37.2 C), temperature source Oral, resp. rate 18, last menstrual period 03/06/2012, SpO2 100.00%.  Physical Exam  Constitutional: She is oriented to person, place, and time. She appears well-developed and well-nourished.  HENT:  Head: Normocephalic.  Neck: Normal range of motion.  Respiratory: Effort normal.  GI:       Exam done at Urgent Care  Genitourinary:       Exam done at urgent care  Neurological: She is alert and oriented to person, place, and time.  Skin: Skin is warm and dry.  Psychiatric: She has a normal mood and affect.    MAU Course  Procedures  GC/CHL cultures  obtained at Urgent Care-results pending.  MDM 20:40 patient is stable to go to ultrasound.   21:00 Care turned over to N. Bascom Levels, CNM   Results for orders placed during the hospital encounter of 03/29/12 (from the past 24 hour(s))  POCT PREGNANCY, URINE     Status: Abnormal   Collection Time   03/29/12  8:36 PM      Component Value Range   Preg Test, Ur POSITIVE (*) NEGATIVE   HCG, QUANTITATIVE, PREGNANCY     Status: Abnormal   Collection Time   03/29/12  8:45 PM      Component Value Range   hCG, Beta Chain, Quant, S 61 (*) <5 (mIU/mL)  ABO/RH     Status: Normal   Collection Time   03/29/12  8:45 PM      Component Value Range   ABO/RH(D) A POS    CBC     Status: Abnormal   Collection Time   03/29/12  8:45 PM      Component Value Range   WBC 6.1  4.0 - 10.5 (K/uL)   RBC 4.41  3.87 - 5.11 (MIL/uL)   Hemoglobin 14.1  12.0 - 15.0 (g/dL)   HCT 14.7  82.9 - 56.2 (%)   MCV 88.7  78.0 - 100.0 (fL)  MCH 32.0  26.0 - 34.0 (pg)   MCHC 36.1 (*) 30.0 - 36.0 (g/dL)   RDW 45.4  09.8 - 11.9 (%)   Platelets 222  150 - 400 (K/uL)   US Ob Comp Less 14 Wks: No IUP or adnexal mass  Assessment and Plan  26 y.o. G3P0030 at Unknown Bleeding and pain in early pregnancy F/U for repeat quant on Saturday, precautions rev'd  KEY,EVE M 03/29/2012, 8:30 PM

## 2012-03-30 LAB — GC/CHLAMYDIA PROBE AMP, GENITAL
Chlamydia, DNA Probe: NEGATIVE
GC Probe Amp, Genital: NEGATIVE

## 2012-03-30 NOTE — MAU Provider Note (Signed)
Attestation of Attending Supervision of Advanced Practitioner: Evaluation and management procedures were performed by the PA/NP/CNM/OB Fellow under my supervision/collaboration. Chart reviewed and agree with management and plan.  Parminder Cupples V 03/30/2012 6:49 AM    

## 2012-04-01 ENCOUNTER — Inpatient Hospital Stay (HOSPITAL_COMMUNITY)
Admission: AD | Admit: 2012-04-01 | Discharge: 2012-04-01 | Disposition: A | Discharge status: Home / Self Care | Payer: Self-pay | Source: Ambulatory Visit | Attending: Family Medicine | Admitting: Family Medicine

## 2012-04-01 ENCOUNTER — Encounter (HOSPITAL_COMMUNITY): Payer: Self-pay | Admitting: Family

## 2012-04-01 DIAGNOSIS — R109 Unspecified abdominal pain: Secondary | ICD-10-CM | POA: Insufficient documentation

## 2012-04-01 DIAGNOSIS — Z3201 Encounter for pregnancy test, result positive: Secondary | ICD-10-CM

## 2012-04-01 DIAGNOSIS — O99891 Other specified diseases and conditions complicating pregnancy: Secondary | ICD-10-CM | POA: Insufficient documentation

## 2012-04-01 DIAGNOSIS — O26899 Other specified pregnancy related conditions, unspecified trimester: Secondary | ICD-10-CM

## 2012-04-01 LAB — HCG, QUANTITATIVE, PREGNANCY: hCG, Beta Chain, Quant, S: 247 m[IU]/mL — ABNORMAL HIGH (ref <5)

## 2012-04-01 NOTE — MAU Note (Signed)
Patient had pain on left side last night none now, denies vaginal bleeding here for BHCG

## 2012-04-01 NOTE — Discharge Instructions (Signed)
Pregnancy - First Trimester  During sexual intercourse, millions of sperm go into the vagina. Only 1 sperm will penetrate and fertilize the female egg while it is in the Fallopian tube. One week later, the fertilized egg implants into the wall of the uterus. An embryo begins to develop into a baby. At 6 to 8 weeks, the eyes and face are formed and the heartbeat can be seen on ultrasound. At the end of 12 weeks (first trimester), all the baby's organs are formed. Now that you are pregnant, you will want to do everything you can to have a healthy baby. Two of the most important things are to get good prenatal care and follow your caregiver's instructions. Prenatal care is all the medical care you receive before the baby's birth. It is given to prevent, find, and treat problems during the pregnancy and childbirth.  PRENATAL EXAMS   During prenatal visits, your weight, blood pressure and urine are checked. This is done to make sure you are healthy and progressing normally during the pregnancy.   A pregnant woman should gain 25 to 35 pounds during the pregnancy. However, if you are over weight or underweight, your caregiver will advise you regarding your weight.   Your caregiver will ask and answer questions for you.   Blood work, cervical cultures, other necessary tests and a Pap test are done during your prenatal exams. These tests are done to check on your health and the probable health of your baby. Tests are strongly recommended and done for HIV with your permission. This is the virus that causes AIDS. These tests are done because medications can be given to help prevent your baby from being born with this infection should you have been infected without knowing it. Blood work is also used to find out your blood type, previous infections and follow your blood levels (hemoglobin).   Low hemoglobin (anemia) is common during pregnancy. Iron and vitamins are given to help prevent this. Later in the pregnancy, blood  tests for diabetes will be done along with any other tests if any problems develop. You may need tests to make sure you and the baby are doing well.   You may need other tests to make sure you and the baby are doing well.  CHANGES DURING THE FIRST TRIMESTER (THE FIRST 3 MONTHS OF PREGNANCY)  Your body goes through many changes during pregnancy. They vary from person to person. Talk to your caregiver about changes you notice and are concerned about. Changes can include:   Your menstrual period stops.   The egg and sperm carry the genes that determine what you look like. Genes from you and your partner are forming a baby. The female genes determine whether the baby is a boy or a girl.   Your body increases in girth and you may feel bloated.   Feeling sick to your stomach (nauseous) and throwing up (vomiting). If the vomiting is uncontrollable, call your caregiver.   Your breasts will begin to enlarge and become tender.   Your nipples may stick out more and become darker.   The need to urinate more. Painful urination may mean you have a bladder infection.   Tiring easily.   Loss of appetite.   Cravings for certain kinds of food.   At first, you may gain or lose a couple of pounds.   You may have changes in your emotions from day to day (excited to be pregnant or concerned something may go wrong with   the pregnancy and baby).   You may have more vivid and strange dreams.  HOME CARE INSTRUCTIONS    It is very important to avoid all smoking, alcohol and un-prescribed drugs during your pregnancy. These affect the formation and growth of the baby. Avoid chemicals while pregnant to ensure the delivery of a healthy infant.   Start your prenatal visits by the 12th week of pregnancy. They are usually scheduled monthly at first, then more often in the last 2 months before delivery. Keep your caregiver's appointments. Follow your caregiver's instructions regarding medication use, blood and lab tests, exercise, and  diet.   During pregnancy, you are providing food for you and your baby. Eat regular, well-balanced meals. Choose foods such as meat, fish, milk and other low fat dairy products, vegetables, fruits, and whole-grain breads and cereals. Your caregiver will tell you of the ideal weight gain.   You can help morning sickness by keeping soda crackers at the bedside. Eat a couple before arising in the morning. You may want to use the crackers without salt on them.   Eating 4 to 5 small meals rather than 3 large meals a day also may help the nausea and vomiting.   Drinking liquids between meals instead of during meals also seems to help nausea and vomiting.   A physical sexual relationship may be continued throughout pregnancy if there are no other problems. Problems may be early (premature) leaking of amniotic fluid from the membranes, vaginal bleeding, or belly (abdominal) pain.   Exercise regularly if there are no restrictions. Check with your caregiver or physical therapist if you are unsure of the safety of some of your exercises. Greater weight gain will occur in the last 2 trimesters of pregnancy. Exercising will help:   Control your weight.   Keep you in shape.   Prepare you for labor and delivery.   Help you lose your pregnancy weight after you deliver your baby.   Wear a good support or jogging bra for breast tenderness during pregnancy. This may help if worn during sleep too.   Ask when prenatal classes are available. Begin classes when they are offered.   Do not use hot tubs, steam rooms or saunas.   Wear your seat belt when driving. This protects you and your baby if you are in an accident.   Avoid raw meat, uncooked cheese, cat litter boxes and soil used by cats throughout the pregnancy. These carry germs that can cause birth defects in the baby.   The first trimester is a good time to visit your dentist for your dental health. Getting your teeth cleaned is OK. Use a softer toothbrush and brush  gently during pregnancy.   Ask for help if you have financial, counseling or nutritional needs during pregnancy. Your caregiver will be able to offer counseling for these needs as well as refer you for other special needs.   Do not take any medications or herbs unless told by your caregiver.   Inform your caregiver if there is any mental or physical domestic violence.   Make a list of emergency phone numbers of family, friends, hospital, and police and fire departments.   Write down your questions. Take them to your prenatal visit.   Do not douche.   Do not cross your legs.   If you have to stand for long periods of time, rotate you feet or take small steps in a circle.   You may have more vaginal secretions that may   require a sanitary pad. Do not use tampons or scented sanitary pads.  MEDICATIONS AND DRUG USE IN PREGNANCY   Take prenatal vitamins as directed. The vitamin should contain 1 milligram of folic acid. Keep all vitamins out of reach of children. Only a couple vitamins or tablets containing iron may be fatal to a baby or young child when ingested.   Avoid use of all medications, including herbs, over-the-counter medications, not prescribed or suggested by your caregiver. Only take over-the-counter or prescription medicines for pain, discomfort, or fever as directed by your caregiver. Do not use aspirin, ibuprofen, or naproxen unless directed by your caregiver.   Let your caregiver also know about herbs you may be using.   Alcohol is related to a number of birth defects. This includes fetal alcohol syndrome. All alcohol, in any form, should be avoided completely. Smoking will cause low birth rate and premature babies.   Street or illegal drugs are very harmful to the baby. They are absolutely forbidden. A baby born to an addicted mother will be addicted at birth. The baby will go through the same withdrawal an adult does.   Let your caregiver know about any medications that you have to take  and for what reason you take them.  MISCARRIAGE IS COMMON DURING PREGNANCY  A miscarriage does not mean you did something wrong. It is not a reason to worry about getting pregnant again. Your caregiver will help you with questions you may have. If you have a miscarriage, you may need minor surgery.  SEEK MEDICAL CARE IF:   You have any concerns or worries during your pregnancy. It is better to call with your questions if you feel they cannot wait, rather than worry about them.  SEEK IMMEDIATE MEDICAL CARE IF:    An unexplained oral temperature above 102 F (38.9 C) develops, or as your caregiver suggests.   You have leaking of fluid from the vagina (birth canal). If leaking membranes are suspected, take your temperature and inform your caregiver of this when you call.   There is vaginal spotting or bleeding. Notify your caregiver of the amount and how many pads are used.   You develop a bad smelling vaginal discharge with a change in the color.   You continue to feel sick to your stomach (nauseated) and have no relief from remedies suggested. You vomit blood or coffee ground-like materials.   You lose more than 2 pounds of weight in 1 week.   You gain more than 2 pounds of weight in 1 week and you notice swelling of your face, hands, feet, or legs.   You gain 5 pounds or more in 1 week (even if you do not have swelling of your hands, face, legs, or feet).   You get exposed to German measles and have never had them.   You are exposed to fifth disease or chickenpox.   You develop belly (abdominal) pain. Round ligament discomfort is a common non-cancerous (benign) cause of abdominal pain in pregnancy. Your caregiver still must evaluate this.   You develop headache, fever, diarrhea, pain with urination, or shortness of breath.   You fall or are in a car accident or have any kind of trauma.   There is mental or physical violence in your home.  Document Released: 11/16/2001 Document Revised: 11/11/2011  Document Reviewed: 05/20/2009  [employer] Patient Information 2012 [employer]

## 2012-04-01 NOTE — MAU Provider Note (Signed)
  History     CSN: [id number]  Arrival date and time: 04/01/12 1414   First Provider Initiated Contact with Patient 04/01/12 1547      Chief Complaint  Patient presents with  . Follow-up   HPI  Pt is here for repeat BHCG.  Seen in MAU on 03/29/12 for bleeding and pain.  Denies bleeding or pain today.  A POS Ultrasound results showed no ectopic or IUP.  BHCG Results for Madison [name], Madison [name] (MRN [id number]) as of 04/01/2012 15:45  Ref. Range 03/29/2012 20:45 03/29/2012 21:57 03/30/2012 10:11 03/31/2012 06:39 04/01/2012 14:35  hCG, Beta Chain, Quant, S Latest Range: <5 mIU/mL 61 (H)    247 (H)    Past Medical History  Diagnosis Date  . No pertinent past medical history     Past Surgical History  Procedure Date  . Dilation and curettage of uterus     Family History  Problem Relation Age of Onset  . Parkinsonism Other   . Diabetes Other   . Schizophrenia Other     History  Substance Use Topics  . Smoking status: Never Smoker   . Smokeless tobacco: Not on file  . Alcohol Use: Yes     on weekends    Allergies: No Known Allergies  Prescriptions prior to admission  Medication Sig Dispense Refill  . promethazine (PHENERGAN) 25 MG tablet Take 1 tablet (25 mg total) by mouth every 6 (six) hours as needed for nausea.  60 tablet  0    ROS Physical Exam   Blood pressure 111/66, pulse 75, temperature 98.4 F (36.9 C), temperature source Oral, resp. rate 16, height 5\' 6"  (1.676 m), weight 71.85 kg (158 lb 6.4 oz), last menstrual period 03/06/2012.  Physical Exam  Constitutional: She is oriented to person, place, and time. She appears well-developed and well-nourished. No distress.  HENT:  Head: Normocephalic.  Neck: Normal range of motion. Neck supple.  Neurological: She is alert and oriented to person, place, and time. She has normal reflexes.  Skin: Skin is warm and dry.    MAU Course  Procedures  Results for Madison [name], Madison [name] (MRN [id number]) as of 04/01/2012 15:45  Ref. Range 03/29/2012 20:45 03/29/2012 21:57 03/30/2012 10:11 03/31/2012 06:39 04/01/2012 14:35  hCG, Beta Chain, Quant, S Latest Range: <5 mIU/mL 61 (H)    247 (H)   Assessment and Plan  Pregnancy  Plan: Reviewed ectopic precautions Will return for repeat BHCG in one week and then ultrasound in two weeks   [facility] 04/01/2012, 3:52 PM

## 2012-04-02 NOTE — MAU Provider Note (Signed)
Chart reviewed and agree with management and plan.  

## 2012-04-08 ENCOUNTER — Encounter (HOSPITAL_COMMUNITY): Payer: Self-pay | Admitting: *Deleted

## 2012-04-08 ENCOUNTER — Inpatient Hospital Stay (HOSPITAL_COMMUNITY)
Admission: AD | Admit: 2012-04-08 | Discharge: 2012-04-08 | Disposition: A | Discharge status: Home / Self Care | Payer: Self-pay | Source: Ambulatory Visit | Attending: Family Medicine | Admitting: Family Medicine

## 2012-04-08 DIAGNOSIS — O26859 Spotting complicating pregnancy, unspecified trimester: Secondary | ICD-10-CM

## 2012-04-08 DIAGNOSIS — Z3490 Encounter for supervision of normal pregnancy, unspecified, unspecified trimester: Secondary | ICD-10-CM

## 2012-04-08 DIAGNOSIS — O99891 Other specified diseases and conditions complicating pregnancy: Secondary | ICD-10-CM | POA: Insufficient documentation

## 2012-04-08 DIAGNOSIS — R109 Unspecified abdominal pain: Secondary | ICD-10-CM | POA: Insufficient documentation

## 2012-04-08 NOTE — MAU Note (Signed)
Pt states, " I 'm here for for blood work.  I was here last Saturday and I had been bleeding."  Pt denies vaginal bleeding or pain

## 2012-04-08 NOTE — MAU Provider Note (Signed)
Madison [name] is a 26 y.o. female @ [redacted]w[redacted]d gestation who presents to MAU for follow up. She was evaluated here 4/24 and her Bhcg was 61. Her blood type is A positive. She returned 4/27 and the Bhcg increased to 247.  Bhcg pending. Care turned over to [facility], CNM  Today's [facility] is [phone].  Pt informed of normal lab values. F/U next week for u/s or sooner if develops pain/bleeding

## 2012-04-08 NOTE — Discharge Instructions (Signed)
Prenatal Care  WHAT IS PRENATAL CARE?  Prenatal care means health care during your pregnancy, before your baby is born. Take care of yourself and your baby by:   Getting early prenatal care. If you know you are pregnant, or think you might be pregnant, call your caregiver as soon as possible. Schedule a visit for a general/prenatal examination.   Getting regular prenatal care. Follow your caregiver's schedule for blood and other necessary tests. Do not miss appointments.   Do everything you can to keep yourself and your baby healthy during your pregnancy.   Prenatal care should include evaluation of medical, dietary, educational, psychological, and social needs for the couple and the medical, surgical, and genetic history of the family of the mother and father.   Discuss with your caregiver:   Your medicines, prescription, over-the-counter, and herbal medicines.   Substance abuse, alcohol, smoking, and illegal drugs.   Domestic abuse and violence, if present.   Your immunizations.   Nutrition and diet.   Exercising.   Environment and occupational hazards, at home and at work.   History of sexually transmitted disease, both you and your partner.   Previous pregnancies.  WHY IS PRENATAL CARE SO IMPORTANT?  By seeing you regularly, your caregiver has the chance to find problems early, so that they can be treated as soon as possible. Other problems might be prevented. Many studies have shown that early and regular prenatal care is important for the health of both mothers and their babies.  I AM THINKING ABOUT GETTING PREGNANT. HOW CAN I TAKE CARE OF MYSELF?  Taking care of yourself before you get pregnant helps you to have a healthy pregnancy. It also lowers your chances of having a baby born with a birth defect. Here are ways to take care of yourself before you get pregnant:   Eat healthy foods, exercise regularly (30 minutes per day for most days of the week is best), and get enough  rest and sleep. Talk to your caregiver about what kinds of foods and exercises are best for you.   Take 400 micrograms (mcg) of folic acid (one of the B vitamins) every day. The best way to do this is to take a daily multivitamin pill that contains this amount of folic acid. Getting enough of the synthetic (manufactured) form of folic acid every day before you get pregnant and during early pregnancy can help prevent certain birth defects. Many breakfast cereals and other grain products have folic acid added to them, but only certain cereals contain 400 mcg of folic acid per serving. Check the label on your multivitamin or cereal to find the amount of folic acid in the food.   See your caregiver for a complete check up before getting pregnant. Make sure that you have had all your immunization shots, especially for rubella (German measles). Rubella can cause serious birth defects. Chickenpox is another illness you want to avoid during pregnancy. If you have had chickenpox and rubella in the past, you should be immune to them.   Tell your caregiver about any prescription or non-prescription medicines (including herbal remedies) you are taking. Some medicines are not safe to take during pregnancy.   Stop smoking cigarettes, drinking alcohol, or taking illegal drugs. Ask your caregiver for help, if you need it. You can also get help with alcohol and drugs by talking with a member of your faith community, a counselor, or a trusted friend.   Discuss and treat any medical, social, or psychological   problems before getting pregnant.   Discuss any history of genetic problems in the mother, father, and their families. Do genetic testing before getting pregnant, when possible.   Discuss any physical or emotional abuse with your caregiver.   Discuss with your caregiver if you might be exposed to harmful chemicals on your job or where you live.   Discuss with your caregiver if you think your job or the hours you  work may be harmful and should be changed.   The father should be involved with the decision making and with all aspects of the pregnancy, labor, and delivery.   If you have medical insurance, make sure you are covered for pregnancy.  I JUST FOUND OUT THAT I AM PREGNANT. HOW CAN I TAKE CARE OF MYSELF?  Here are ways to take care of yourself and the precious new life growing inside you:   Continue taking your multivitamin with 400 micrograms (mcg) of folic acid every day.   Get early and regular prenatal care. It does not matter if this is your first pregnancy or if you already have children. It is very important to see a caregiver during your pregnancy. Your caregiver will check at each visit to make sure that you and the baby are healthy. If there are any problems, action can be taken right away to help you and the baby.   Eat a healthy diet that includes:   Fruits.   Vegetables.   Foods low in saturated fat.   Grains.   Calcium-rich foods.   Drink 6 to 8 glasses of liquids a day.   Unless your caregiver tells you not to, try to be physically active for 30 minutes, most days of the week. If you are pressed for time, you can get your activity in through 10 minute segments, three times a day.   If you smoke, drink alcohol, or use drugs, STOP. These can cause long-term damage to your baby. Talk with your caregiver about steps to take to stop smoking. Talk with a member of your faith community, a counselor, a trusted friend, or your caregiver if you are concerned about your alcohol or drug use.   Ask your caregiver before taking any medicine, even over-the-counter medicines. Some medicines are not safe to take during pregnancy.   Get plenty of rest and sleep.   Avoid hot tubs and saunas during pregnancy.   Do not have X-rays taken, unless absolutely necessary and with the recommendation of your caregiver. A lead shield can be placed on your abdomen, to protect the baby when X-rays  are taken in other parts of the body.   Do not empty the cat litter when you are pregnant. It may contain a parasite that causes an infection called toxoplasmosis, which can cause birth defects. Also, use gloves when working in garden areas used by cats.   Do not eat uncooked or undercooked cheese, meats, or fish.   Stay away from toxic chemicals like:   Insecticides.   Solvents (some cleaners or paint thinners).   Lead.   Mercury.   Sexual relations may continue until the end of the pregnancy, unless you have a medical problem or there is a problem with the pregnancy and your caregiver tells you not to.   Do not wear high heel shoes, especially during the second half of the pregnancy. You can lose your balance and fall.   Do not take long trips, unless absolutely necessary. Be sure to see your caregiver before   going on the trip.   Do not sit in one position for more than 2 hours, when on a trip.   Take a copy of your medical records when going on a trip.   Know where there is a hospital in the city you are visiting, in case of an emergency.   Most dangerous household products will have pregnancy warnings on their labels. Ask your caregiver about products if you are unsure.   Limit or eliminate your caffeine intake from coffee, tea, sodas, medicines, and chocolate.   Many women continue working through pregnancy. Staying active might help you stay healthier. If you have a question about the safety or the hours you work at your particular job, talk with your caregiver.   Get informed:   Read books.   Watch videos.   Go to childbirth classes for you and the father.   Talk with experienced moms.   Ask your caregiver about childbirth education classes for you and your partner. Classes can help you and your partner prepare for the birth of your baby.   Ask about a pediatrician (baby doctor) and methods and pain medicine for labor, delivery, and possible Cesarean delivery  (C-section).  I AM NOT THINKING ABOUT GETTING PREGNANT RIGHT NOW, BUT HEARD THAT ALL WOMEN SHOULD TAKE FOLIC ACID EVERY DAY?  All women of childbearing age, with even a remote chance of getting pregnant, should try to make sure they get enough folic acid. Many pregnancies are not planned. Many women do not know they are actually pregnant early in their pregnancies, and certain birth defects happen in the very early part of pregnancy. Taking 400 micrograms (mcg) of folic acid every day will help prevent certain birth defects that happen in the early part of pregnancy. If a woman begins taking vitamin pills in the second or third month of pregnancy, it may be too late to prevent birth defects. Folic acid may also have other health benefits for women, besides preventing birth defects.  HOW OFTEN SHOULD I SEE MY CAREGIVER DURING PREGNANCY?  Your caregiver will give you a schedule for your prenatal visits. You will have visits more often as you get closer to the end of your pregnancy. An average pregnancy lasts about 40 weeks.  A typical schedule includes visiting your caregiver:   About once each month, during your first 6 months of pregnancy.   Every 2 weeks, during the next 2 months.   Weekly in the last month, until the delivery date.  Your caregiver will probably want to see you more often if:  You are over 35.   Your pregnancy is high risk, because you have certain health problems or problems with the pregnancy, such as:   Diabetes.   High blood pressure.   The baby is not growing on schedule, according to the dates of the pregnancy.  Your caregiver will do special tests, to make sure you and the baby are not having any serious problems. WHAT HAPPENS DURING PRENATAL VISITS?   At your first prenatal visit, your caregiver will talk to you about you and your partner's health history and your family's health history, and will do a physical exam.   On your first visit, a physical exam will  include checks of your blood pressure, height and weight, and an exam of your pelvic organs. Your caregiver will do a Pap test if you have not had one recently, and will do cultures of your cervix to make sure there is no   infection.   At each visit, there will be tests of your blood, urine, blood pressure, weight, and checking the progress of the baby.   Your caregiver will be able to tell you when to expect that your baby will be born.   Each visit is also a chance for you to learn about staying healthy during pregnancy and for asking questions.   Discuss whether you will be breastfeeding.   At your later prenatal visits, your caregiver will check how you are doing and how the baby is developing. You may have a number of tests done as your pregnancy progresses.   Ultrasound exams are often used to check on the baby's growth and health.   You may have more urine and blood tests, as well as special tests, if needed. These may include amniocentesis (examine fluid in the pregnancy sac), stress tests (check how baby responds to contractions), biophysical profile (measures fetus well-being). Your caregiver will explain the tests and why they are necessary.  I AM IN MY LATE THIRTIES, AND I WANT TO HAVE A CHILD NOW. SHOULD I DO ANYTHING SPECIAL?  As you get older, there is more chance of having a medical problem (high blood pressure), pregnancy problem (preeclampsia, problems with the placenta), miscarriage, or a baby born with a birth defect. However, most women in their late thirties and early forties have healthy babies. See your caregiver on a regular basis before you get pregnant and be sure to go for exams throughout your pregnancy. Your caregiver probably will want to do some special tests to check on you and your baby's health when you are pregnant.  Women today are often delaying having children until later in life, when they are in their thirties and forties. While many women in their thirties  and forties have no difficulty getting pregnant, fertility does decline with age. For women over 40 who cannot get pregnant after 6 months of trying, it is recommended that they see their caregiver for a fertility evaluation. It is not uncommon to have trouble becoming pregnant or experience infertility (inability to become pregnant after trying for one year). If you think that you or your partner may be infertile, you can discuss this with your caregiver. He or she can recommend treatments such as drugs, surgery, or assisted reproductive technology.  Document Released: 11/25/2003 Document Revised: 11/11/2011 Document Reviewed: 10/22/2009 ExitCare Patient Information 2012 [employer]. 

## 2012-04-09 NOTE — MAU Provider Note (Signed)
Chart reviewed and agree with management and plan.  

## 2012-04-12 ENCOUNTER — Ambulatory Visit (HOSPITAL_COMMUNITY)
Admission: RE | Admit: 2012-04-12 | Discharge: 2012-04-12 | Disposition: A | Discharge status: Home / Self Care | Payer: Self-pay | Source: Ambulatory Visit | Attending: Advanced Practice Midwife | Admitting: Advanced Practice Midwife

## 2012-04-12 ENCOUNTER — Inpatient Hospital Stay (HOSPITAL_COMMUNITY)
Admission: AD | Admit: 2012-04-12 | Discharge: 2012-04-12 | Disposition: A | Discharge status: Home / Self Care | Payer: Self-pay | Source: Ambulatory Visit | Attending: Obstetrics & Gynecology | Admitting: Obstetrics & Gynecology

## 2012-04-12 DIAGNOSIS — O3680X0 Pregnancy with inconclusive fetal viability, not applicable or unspecified: Secondary | ICD-10-CM | POA: Insufficient documentation

## 2012-04-12 DIAGNOSIS — R109 Unspecified abdominal pain: Secondary | ICD-10-CM

## 2012-04-12 DIAGNOSIS — Z3201 Encounter for pregnancy test, result positive: Secondary | ICD-10-CM

## 2012-04-12 DIAGNOSIS — Z3689 Encounter for other specified antenatal screening: Secondary | ICD-10-CM | POA: Insufficient documentation

## 2012-04-12 NOTE — MAU Note (Signed)
Pt states here for u/s for viability, denies pain or bleeding, did note very light pink smear on tissue when she wiped yesterday, none today.

## 2012-04-14 ENCOUNTER — Encounter (HOSPITAL_COMMUNITY): Payer: Self-pay | Admitting: *Deleted

## 2012-04-14 ENCOUNTER — Inpatient Hospital Stay (HOSPITAL_COMMUNITY)
Admission: AD | Admit: 2012-04-14 | Discharge: 2012-04-14 | Disposition: A | Discharge status: Home / Self Care | Payer: Self-pay | Source: Ambulatory Visit | Attending: Obstetrics and Gynecology | Admitting: Obstetrics and Gynecology

## 2012-04-14 DIAGNOSIS — O2390 Unspecified genitourinary tract infection in pregnancy, unspecified trimester: Secondary | ICD-10-CM | POA: Insufficient documentation

## 2012-04-14 DIAGNOSIS — N760 Acute vaginitis: Secondary | ICD-10-CM

## 2012-04-14 DIAGNOSIS — B9689 Other specified bacterial agents as the cause of diseases classified elsewhere: Secondary | ICD-10-CM | POA: Insufficient documentation

## 2012-04-14 DIAGNOSIS — A499 Bacterial infection, unspecified: Secondary | ICD-10-CM | POA: Insufficient documentation

## 2012-04-14 DIAGNOSIS — Z331 Pregnant state, incidental: Secondary | ICD-10-CM

## 2012-04-14 DIAGNOSIS — N949 Unspecified condition associated with female genital organs and menstrual cycle: Secondary | ICD-10-CM | POA: Insufficient documentation

## 2012-04-14 LAB — URINALYSIS, ROUTINE W REFLEX MICROSCOPIC
Glucose, UA: NEGATIVE mg/dL
Hgb urine dipstick: NEGATIVE
Specific Gravity, Urine: 1.02 (ref 1.005–1.030)
Urobilinogen, UA: 0.2 mg/dL (ref 0.0–1.0)
pH: 5.5 (ref 5.0–8.0)

## 2012-04-14 MED ORDER — METRONIDAZOLE 500 MG PO TABS: 500.0000 mg | ORAL_TABLET | Freq: Two times a day (BID) | ORAL | Status: AC

## 2012-04-14 NOTE — MAU Note (Signed)
Light pink discharge past 2 days. Today is light brownish.  Breasts not as tender.  Has had some cramping.

## 2012-04-14 NOTE — MAU Provider Note (Signed)
Chief Complaint:  Vaginal Discharge    First Provider Initiated Contact with Patient 04/14/12 [address] Madison [name] is  26 y.o. [phone].  Patient's last menstrual period was 03/06/2012.Marland Kitchen  [redacted]w[redacted]d by [place]. She presents complaining of Vaginal Discharge . Onset is described as intermittent and has been present for  1-2 days. Pink tinged on tissue with wiping.  Occasational mild lower abd cramping.   Obstetrical/Gynecological History: OB History    Grav Para Term Preterm Abortions TAB SAB Ect Mult Living   4 0 0 0 3 0 3 0 0 0       Past Medical History: Past Medical History  Diagnosis Date  . No pertinent past medical history     Past Surgical History: Past Surgical History  Procedure Date  . Dilation and curettage of uterus     Family History: Family History  Problem Relation Age of Onset  . Parkinsonism Other   . Diabetes Other   . Schizophrenia Other   . Anesthesia problems Neg Hx     Social History: History  Substance Use Topics  . Smoking status: Never Smoker   . Smokeless tobacco: Not on file  . Alcohol Use: Yes     on weekends/ stopped with knowledge of pregnancy     Allergies: No Known Allergies  No prescriptions prior to admission    Review of Systems - History obtained from the patient Endocrine ROS: negative Breast ROS: negative for - tenderness Respiratory ROS: no cough, shortness of breath, or wheezing Cardiovascular ROS: no chest pain or dyspnea on exertion Gastrointestinal ROS: no abdominal pain, change in bowel habits, or black or bloody stools Mild cramping Genito-Urinary ROS: no dysuria, trouble voiding, or hematuria  Physical Exam   Blood pressure 104/60, pulse 74, temperature 98.2 F (36.8 C), temperature source Oral, resp. rate 18, height 5\' 5"  (1.651 m), weight 157 lb (71.215 kg), last menstrual period 03/06/2012.  General: General appearance - alert, well appearing, and in no distress and oriented to person, place, and time Mental  status - alert, oriented to person, place, and time, normal mood, behavior, speech, dress, motor activity, and thought processes, affect appropriate to mood Abdomen - soft, nontender, nondistended, no masses or organomegaly Focused Gynecological Exam: VULVA: normal appearing vulva with no masses, tenderness or lesions, VAGINA: vaginal discharge - malodorous, milky and tan in color, CERVIX: normal appearing cervix without discharge or lesions, UTERUS: uterus is normal size, shape, consistency and nontender, enlarged to 5 week's size, ADNEXA: normal adnexa in size, nontender and no masses  Labs: Recent Results (from the past 24 hour(s))  WET PREP, GENITAL   Collection Time   04/14/12  1:03 PM      Component Value Range   Yeast Wet Prep HPF POC NONE SEEN  NONE SEEN    Trich, Wet Prep NONE SEEN  NONE SEEN    Clue Cells Wet Prep HPF POC MANY (*) NONE SEEN    WBC, Wet Prep HPF POC FEW (*) NONE SEEN   URINALYSIS, ROUTINE W REFLEX MICROSCOPIC   Collection Time   04/14/12  1:39 PM      Component Value Range   Color, Urine YELLOW  YELLOW    APPearance CLEAR  CLEAR    Specific Gravity, Urine 1.020  1.005 - 1.030    pH 5.5  5.0 - 8.0    Glucose, UA NEGATIVE  NEGATIVE (mg/dL)   Hgb urine dipstick NEGATIVE  NEGATIVE    Bilirubin Urine NEGATIVE  NEGATIVE    Ketones, ur NEGATIVE  NEGATIVE (mg/dL)   Protein, ur NEGATIVE  NEGATIVE (mg/dL)   Urobilinogen, UA 0.2  0.0 - 1.0 (mg/dL)   Nitrite NEGATIVE  NEGATIVE    Leukocytes, UA NEGATIVE  NEGATIVE     Assessment: 1. BV (bacterial vaginosis)   2. Pregnant state, incidental      Plan: Discharge home Rx flagyl sent to pharmacy  [facility] E. 04/14/2012,2:25 PM

## 2012-04-14 NOTE — Discharge Instructions (Signed)
Bacterial Vaginosis Bacterial vaginosis (BV) is a vaginal infection where the normal balance of bacteria in the vagina is disrupted. The normal balance is then replaced by an overgrowth of certain bacteria. There are several different kinds of bacteria that can cause BV. BV is the most common vaginal infection in women of childbearing age. CAUSES   The cause of BV is not fully understood. BV develops when there is an increase or imbalance of harmful bacteria.   Some activities or behaviors can upset the normal balance of bacteria in the vagina and put women at increased risk including:   Having a new sex partner or multiple sex partners.   Douching.   Using an intrauterine device (IUD) for contraception.   It is not clear what role sexual activity plays in the development of BV. However, women that have never had sexual intercourse are rarely infected with BV.  Women do not get BV from toilet seats, bedding, swimming pools or from touching objects around them.  SYMPTOMS   Grey vaginal discharge.   A fish-like odor with discharge, especially after sexual intercourse.   Itching or burning of the vagina and vulva.   Burning or pain with urination.   Some women have no signs or symptoms at all.  DIAGNOSIS  Your caregiver must examine the vagina for signs of BV. Your caregiver will perform lab tests and look at the sample of vaginal fluid through a microscope. They will look for bacteria and abnormal cells (clue cells), a pH test higher than 4.5, and a positive amine test all associated with BV.  RISKS AND COMPLICATIONS   Pelvic inflammatory disease (PID).   Infections following gynecology surgery.   Developing HIV.   Developing herpes virus.  TREATMENT  Sometimes BV will clear up without treatment. However, all women with symptoms of BV should be treated to avoid complications, especially if gynecology surgery is planned. Female partners generally do not need to be treated. However,  BV may spread between female sex partners so treatment is helpful in preventing a recurrence of BV.   BV may be treated with antibiotics. The antibiotics come in either pill or vaginal cream forms. Either can be used with nonpregnant or pregnant women, but the recommended dosages differ. These antibiotics are not harmful to the baby.   BV can recur after treatment. If this happens, a second round of antibiotics will often be prescribed.   Treatment is important for pregnant women. If not treated, BV can cause a premature delivery, especially for a pregnant woman who had a premature birth in the past. All pregnant women who have symptoms of BV should be checked and treated.   For chronic reoccurrence of BV, treatment with a type of prescribed gel vaginally twice a week is helpful.  HOME CARE INSTRUCTIONS   Finish all medication as directed by your caregiver.   Do not have sex until treatment is completed.   Tell your sexual partner that you have a vaginal infection. They should see their caregiver and be treated if they have problems, such as a mild rash or itching.   Practice safe sex. Use condoms. Only have 1 sex partner.  PREVENTION  Basic prevention steps can help reduce the risk of upsetting the natural balance of bacteria in the vagina and developing BV:  Do not have sexual intercourse (be abstinent).   Do not douche.   Use all of the medicine prescribed for treatment of BV, even if the signs and symptoms go away.     Tell your sex partner if you have BV. That way, they can be treated, if needed, to prevent reoccurrence.  SEEK MEDICAL CARE IF:   Your symptoms are not improving after 3 days of treatment.   You have increased discharge, pain, or fever.  MAKE SURE YOU:   Understand these instructions.   Will watch your condition.   Will get help right away if you are not doing well or get worse.  FOR MORE INFORMATION  Division of STD Prevention (DSTDP), Centers for Disease  Control and Prevention: SolutionApps.[employer].za American Social Health Association (ASHA): www.ashastd.org  Document Released: 11/22/2005 Document Revised: 11/11/2011 Document Reviewed: 05/15/2009 ExitCare Patient Information 2012 [place] [employer]   ________________________________________     To schedule your Maternity Eligibility Appointment, please call [phone].  When you arrive for your appointment you must bring the following items or information listed below.  Your appointment will be rescheduled if you do not have these items or are 15 minutes late. If currently receiving Medicaid, you MUST bring: 1. Medicaid Card 2. Social Security Card 3. Picture ID 4. Proof of Pregnancy 5. Verification of current address if the address on Medicaid card is incorrect "postmarked mail" If not receiving Medicaid, you MUST bring: 1. Social Security Card 2. Picture ID 3. Birth Certificate (if available) Passport or *Green Card 4. Proof of Pregnancy 5. Verification of current address "postmarked mail" for each income presented. 6. Verification of insurance coverage, if any 7. Check stubs from each employer for the previous month (if unable to present check stub  for each week, we will accept check stub for the first and last week ill the same month.) If you can't locate check stubs, you must bring a letter from the employer(s) and it must have the following information on letterhead, typed, in English: o name of company o company telephone number o how long been with the company, if less than one month o how much person earns per hour o how many hours per week work o the gross pay the person earned for the previous month If you are 25 years old or less, you do not have to bring proof of income unless you work or live with the father of the baby and at that time we will need proof of income from you and/or the father of the baby. Green Card recipients are eligible for Medicaid for Pregnant Women  (MPW)   Prenatal Care Providers [facility]    [facility]  [phone]     Phone: [phone]          [facility]                      [facility]  @[place]     Phone: [phone]  Phone: [phone]         [facility]     Phone: [phone]  Phone: [phone]           [facility] [place]                hone: [phone]  Phone: [phone]         [facility] [address] Francoise Ceo      Phone: [phone]  Phone: [phone]         [facility]  Phone: [phone]  [facility]   Phone:[phone]    [facility] [address] [place])          Phone: [phone] [facility]   Phone: [phone] .

## 2012-04-14 NOTE — MAU Note (Signed)
Pt in c/o spotting with wiping x2 days.  States yesterday it was pink and now is a light brown color.  Reports mild cramping x2 days.

## 2012-04-17 ENCOUNTER — Encounter (HOSPITAL_COMMUNITY): Payer: Self-pay | Admitting: *Deleted

## 2012-04-17 ENCOUNTER — Inpatient Hospital Stay (HOSPITAL_COMMUNITY)
Admission: AD | Admit: 2012-04-17 | Discharge: 2012-04-17 | Disposition: A | Discharge status: Home / Self Care | Payer: Self-pay | Source: Ambulatory Visit | Attending: Obstetrics & Gynecology | Admitting: Obstetrics & Gynecology

## 2012-04-17 ENCOUNTER — Inpatient Hospital Stay (HOSPITAL_COMMUNITY): Payer: Self-pay

## 2012-04-17 DIAGNOSIS — O209 Hemorrhage in early pregnancy, unspecified: Secondary | ICD-10-CM | POA: Insufficient documentation

## 2012-04-17 DIAGNOSIS — O26859 Spotting complicating pregnancy, unspecified trimester: Secondary | ICD-10-CM

## 2012-04-17 NOTE — MAU Provider Note (Signed)
Agree with above note.  Madison [name] 04/17/2012 11:59 AM

## 2012-04-17 NOTE — MAU Provider Note (Signed)
History     CSN: [id number]  Arrival date and time: 04/17/12 1510   First Provider Initiated Contact with Patient 04/17/12 1545      Chief Complaint  Patient presents with  . Vaginal Bleeding   HPIAmber [name] [name] is 26 y.o. G4P0030 [redacted]w[redacted]d weeks presenting with report of a blood clot and some on the tissue 1 hour ago.  She was home alone and was worried.  She has been followed here since 4/24 for repeat BHCG and ultrasounds for cramping and incidental + UPT.  Denies cramping but a mild ache on right side.      Past Medical History  Diagnosis Date  . No pertinent past medical history     Past Surgical History  Procedure Date  . Dilation and curettage of uterus     Family History  Problem Relation Age of Onset  . Parkinsonism Other   . Diabetes Other   . Schizophrenia Other   . Anesthesia problems Neg Hx     History  Substance Use Topics  . Smoking status: Never Smoker   . Smokeless tobacco: Not on file  . Alcohol Use: Yes     on weekends/ stopped with knowledge of pregnancy     Allergies: No Known Allergies  Prescriptions prior to admission  Medication Sig Dispense Refill  . metroNIDAZOLE (FLAGYL) 500 MG tablet Take 1 tablet (500 mg total) by mouth 2 (two) times daily.  14 tablet  0  . Prenatal Vit-Fe Fumarate-FA (PRENATAL MULTIVITAMIN) TABS Take 1 tablet by mouth every morning.         Review of Systems  Constitutional: Negative.   Respiratory: Negative.   Cardiovascular: Negative.   Gastrointestinal: Negative for abdominal pain.  Genitourinary:       + for dark brown discharge and clot.   Physical Exam   Blood pressure 118/66, pulse 90, temperature 99.3 F (37.4 C), temperature source Oral, resp. rate 18, height 5' 5.5" (1.664 m), weight 70.761 kg (156 lb), last menstrual period 03/06/2012.  Physical Exam  Constitutional: She is oriented to person, place, and time. She appears well-developed and well-nourished. No distress.  HENT:  Head:  Normocephalic.  Neck: Normal range of motion.  Cardiovascular: Normal rate.   Respiratory: Effort normal.  GI: Soft. She exhibits no mass. There is no tenderness. There is no rebound and no guarding.  Genitourinary: Uterus is enlarged (slightly). Uterus is not tender. Right adnexum displays no mass, no tenderness and no fullness. Left adnexum displays no mass, no tenderness and no fullness. No bleeding around the vagina. Vaginal discharge (small amount of dark pinkish/brown discharge without odor.  No active bleeding or clot seen.) found.  Neurological: She is alert and oriented to person, place, and time.  Skin: Skin is warm and dry.  Psychiatric: She has a normal mood and affect. Her behavior is normal. Judgment and thought content normal.    MAU Course  Procedures  MDM PREVIOUS LABS 4/24-BHCG 61                               4/27   "        247                                5/4   "         2671.  Ultrasound on 5/8  IUGS with YS  [redacted]w[redacted]d.    Assessment and Plan  A:  Vaginal bleeding in early pregnancy     Viable pregnancy  [redacted]w[redacted]d gestation  P:  Pelvic rest until bleeding stops      Begin prenatal care with doctor of your choice  Matt Holmes 04/17/2012, 3:46 PM

## 2012-04-17 NOTE — MAU Note (Signed)
Felt pressure like she needed to have a BM, passed a large black clump of tissue with blood around it.., still bleeding. Hx of SAB's

## 2012-04-17 NOTE — MAU Note (Signed)
Pt in c/o vaginal bleeding that started 40 minutes ago.  States she saw an inch-long "black,tissue-like clot" pass and then had some bright red bleeding.  Has subsided now.  Reports mild aching on right side of abdomen.

## 2012-04-17 NOTE — Discharge Instructions (Signed)
Vaginal Bleeding During Pregnancy  A small amount of bleeding from the vagina can happen anytime during pregnancy. Be sure to tell your doctor about all vaginal bleeding.   HOME CARE   Get plenty of rest and sleep.   Count the number of pads you use each day. Do not use tampons.   Save any tissue you pass for your doctor to see.   Do not exercise   Do not do any heavy lifting.   Avoid going up and down stairs. If you must climb stairs, go slowly.   Do not have sex (intercourse) or orgasms until approved by your doctor.   Do not douche.   Only take medicine as told by your doctor. Do not take aspirin.   Eat healthy.   Always keep your follow-up appointments.  GET HELP RIGHT AWAY IF:    You feel the baby moving less or not moving at all.   The bleeding gets worse.   You have very painful cramps or pain in your stomach or back.   You pass large clots or anything that looks like tissue.   You have a temperature by mouth above 102 F (38.9 C).   You feel very weak.   You have chills.   You feel dizzy or pass out (faint).   You have a gush of fluid from the vagina.  MAKE SURE YOU:    Understand these instructions.   Will watch your condition.   Will get help right away if you are not doing well or get worse.  Document Released: 08/31/2008 Document Revised: 11/11/2011 Document Reviewed: 10/28/2009  ExitCare Patient Information 2012 [employer].

## 2012-04-18 NOTE — MAU Provider Note (Signed)
Attestation of Attending Supervision of Advanced Practitioner: Evaluation and management procedures were performed by the OB Fellow/PA/CNM/NP under my supervision and collaboration. Chart reviewed, and agree with management and plan.  Rodneshia Greenhouse, M.D. 04/18/2012 9:32 AM   

## 2012-04-19 ENCOUNTER — Ambulatory Visit (HOSPITAL_COMMUNITY)
Admit: 2012-04-19 | Discharge: 2012-04-19 | Disposition: A | Discharge status: Home / Self Care | Payer: Self-pay | Attending: Physician Assistant | Admitting: Physician Assistant

## 2012-04-19 ENCOUNTER — Inpatient Hospital Stay (HOSPITAL_COMMUNITY): Admit: 2012-04-19 | Payer: Self-pay

## 2012-04-19 DIAGNOSIS — O3680X0 Pregnancy with inconclusive fetal viability, not applicable or unspecified: Secondary | ICD-10-CM

## 2012-05-08 ENCOUNTER — Inpatient Hospital Stay (HOSPITAL_COMMUNITY)
Admission: AD | Admit: 2012-05-08 | Discharge: 2012-05-08 | Discharge status: Against Medical Advice | Payer: Self-pay | Source: Ambulatory Visit | Attending: Obstetrics & Gynecology | Admitting: Obstetrics & Gynecology

## 2012-05-08 NOTE — MAU Note (Signed)
Patient is not in the lobby when called to triage.  

## 2012-05-20 ENCOUNTER — Encounter (HOSPITAL_COMMUNITY): Payer: Self-pay

## 2012-05-20 ENCOUNTER — Inpatient Hospital Stay (HOSPITAL_COMMUNITY)
Admission: AD | Admit: 2012-05-20 | Discharge: 2012-05-20 | Disposition: A | Discharge status: Home / Self Care | Payer: Self-pay | Source: Ambulatory Visit | Attending: Obstetrics & Gynecology | Admitting: Obstetrics & Gynecology

## 2012-05-20 DIAGNOSIS — O210 Mild hyperemesis gravidarum: Secondary | ICD-10-CM | POA: Insufficient documentation

## 2012-05-20 DIAGNOSIS — N898 Other specified noninflammatory disorders of vagina: Secondary | ICD-10-CM | POA: Insufficient documentation

## 2012-05-20 DIAGNOSIS — O219 Vomiting of pregnancy, unspecified: Secondary | ICD-10-CM

## 2012-05-20 LAB — WET PREP, GENITAL
Clue Cells Wet Prep HPF POC: NONE SEEN
Trich, Wet Prep: NONE SEEN
Yeast Wet Prep HPF POC: NONE SEEN

## 2012-05-20 LAB — URINALYSIS, ROUTINE W REFLEX MICROSCOPIC
Bilirubin Urine: NEGATIVE
Glucose, UA: NEGATIVE mg/dL
Hgb urine dipstick: NEGATIVE
Specific Gravity, Urine: 1.02 (ref 1.005–1.030)

## 2012-05-20 MED ORDER — PROMETHAZINE HCL 12.5 MG PO TABS: 12.5000 mg | ORAL_TABLET | Freq: Four times a day (QID) | ORAL | Status: AC | PRN

## 2012-05-20 MED ORDER — ONDANSETRON 8 MG PO TBDP
8.0000 mg | ORAL_TABLET | Freq: Once | ORAL | Status: AC
  Administered 2012-05-20: 8 mg via ORAL
  Filled 2012-05-20: qty 1

## 2012-05-20 MED ORDER — ONDANSETRON 8 MG PO TBDP: 8.0000 mg | ORAL_TABLET | Freq: Three times a day (TID) | ORAL | Status: AC | PRN

## 2012-05-20 NOTE — MAU Provider Note (Signed)
Attestation of Attending Supervision of Advanced Practitioner (CNM/NP): Evaluation and management procedures were performed by the Advanced Practitioner under my supervision and collaboration.  I have reviewed the Advanced Practitioner's note and chart, and I agree with the management and plan.  Jaynie Collins, M.D. 05/20/2012 8:45 PM

## 2012-05-20 NOTE — MAU Note (Addendum)
Patient is here with c/o vomiting twice today and yellow thick, burning discharge. She denies any vaginal bleeding. Intermittent abdominal cramping for a week. She states that she is waiting on her mom's insurance to start before starting prenatal care.

## 2012-05-20 NOTE — MAU Provider Note (Signed)
Chief Complaint:  Morning Sickness and Vaginal Discharge    First Provider Initiated Contact with Patient 05/20/12 [address] Madison [name] is  26 y.o. [phone].  Patient's last menstrual period was 03/06/2012..  [redacted]w[redacted]d  She presents complaining of Morning Sickness and Vaginal Discharge . Onset is described as ongoing and has been present for  Several  weeks. Reports daily vomiting. Has not started [facility] yet, desires meds for nausea. States thick white/yellow clumpy discharge x 3-4 days. Was tx for BV 2-3 weeks ago.   Obstetrical/Gynecological History: OB History    Grav Para Term Preterm Abortions TAB SAB Ect Mult Living   4 0 0 0 3 0 3 0 0 0       Past Medical History: Past Medical History  Diagnosis Date  . No pertinent past medical history     Past Surgical History: Past Surgical History  Procedure Date  . Dilation and curettage of uterus     Family History: Family History  Problem Relation Age of Onset  . Parkinsonism Other   . Diabetes Other   . Schizophrenia Other   . Anesthesia problems Neg Hx     Social History: History  Substance Use Topics  . Smoking status: Never Smoker   . Smokeless tobacco: Not on file  . Alcohol Use: Yes     on weekends/ stopped with knowledge of pregnancy     Allergies: No Known Allergies  Prescriptions prior to admission  Medication Sig Dispense Refill  . Prenatal Vit-Fe Fumarate-FA (PRENATAL MULTIVITAMIN) TABS Take 1 tablet by mouth every morning.         Review of Systems - Negative except what has been reviewed in the HPI  Physical Exam   Blood pressure 115/76, pulse 82, temperature 99.1 F (37.3 C), temperature source Oral, resp. rate 20, height 5' 5.5" (1.664 m), weight 154 lb 2 oz (69.911 kg), last menstrual period 03/06/2012.  General: General appearance - alert, well appearing, and in no distress, oriented to person, place, and time and normal appearing weight Mental status - alert, oriented to person, place, and  time, normal mood, behavior, speech, dress, motor activity, and thought processes, affect appropriate to mood Abdomen - soft, nontender, nondistended, no masses or organomegaly Extremities - peripheral pulses normal, no pedal edema, no clubbing or cyanosis Skin - normal coloration and turgor, no rashes, no suspicious skin lesions noted Focused Gynecological Exam: VULVA: normal appearing vulva with no masses, tenderness or lesions, VAGINA: normal appearing vagina with normal color and discharge, no lesions, CERVIX: normal appearing cervix without discharge or lesions, closed/long/firm   Labs: Recent Results (from the past 24 hour(s))  URINALYSIS, ROUTINE W REFLEX MICROSCOPIC   Collection Time   05/20/12  7:17 PM      Component Value Range   Color, Urine YELLOW  YELLOW   APPearance CLEAR  CLEAR   Specific Gravity, Urine 1.020  1.005 - 1.030   pH 6.0  5.0 - 8.0   Glucose, UA NEGATIVE  NEGATIVE mg/dL   Hgb urine dipstick NEGATIVE  NEGATIVE   Bilirubin Urine NEGATIVE  NEGATIVE   Ketones, ur NEGATIVE  NEGATIVE mg/dL   Protein, ur NEGATIVE  NEGATIVE mg/dL   Urobilinogen, UA 0.2  0.0 - 1.0 mg/dL   Nitrite NEGATIVE  NEGATIVE   Leukocytes, UA NEGATIVE  NEGATIVE  WET PREP, GENITAL   Collection Time   05/20/12  8:12 PM      Component Value Range   Yeast Wet Prep  HPF POC NONE SEEN  NONE SEEN   Trich, Wet Prep NONE SEEN  NONE SEEN   Clue Cells Wet Prep HPF POC NONE SEEN  NONE SEEN   WBC, Wet Prep HPF POC FEW (*) NONE SEEN   Imaging Studies:  Informal bedside US: + IUP + fetal movement + cardiac activity   Assessment: Nauseas/Vomiting of pregnancy  Plan: Discharge home Referral for [facility]   Madison [name] E. 05/20/2012,8:35 PM

## 2012-05-20 NOTE — MAU Note (Signed)
Pt states, " I've had a vaginal discharge to two weeks with some burning and I thrown up twice today, and one time it had some blood streaks."

## 2012-05-20 NOTE — Discharge Instructions (Signed)
Morning Sickness Morning sickness is when you feel sick to your stomach (nauseous) during pregnancy. You may feel sick to your stomach and throw up (vomit). You may feel sick in the morning, but you can feel this way any time of day. Some women feel very sick to their stomach and cannot stop throwing up (hyperemesis gravidarum). HOME CARE  Take multivitamins as told by your doctor. Taking multivitamins before getting pregnant can stop or lessen the harshness of morning sickness.   Eat dry toast or unsalted crackers before getting out of bed.   Eat 5 to 6 small meals a day.   Eat dry and bland foods like rice and baked potatoes.   Do not drink liquids with meals. Drink between meals.   Do not eat greasy, fatty, or spicy foods.   Have someone cook for you if the smell of food causes you to feel sick or throw up.   Do not take vitamins with iron, or as told by your doctor.   Eat protein when you need a snack (nuts, yogurt, cheese).   Eat unsweetened gelatins for dessert.   Wear a bracelet used for sea sickness (acupressure wristband).   Go to a doctor that puts thin needles into certain body points (acupuncture) to improve how you feel.   Do not smoke.   Use a humidifier to keep the air in your house free of odors.  GET HELP RIGHT AWAY IF:   You feel very sick to your stomach and cannot stop throwing up.   You pass out (faint).   You have a fever.   You need medicine to feel better.   You feel dizzy or lightheaded.   You are losing weight.   You need help knowing what to eat and what not to eat.  MAKE SURE YOU:   Understand these instructions.   Will watch your condition.   Will get help right away if you are not doing well or get worse.  Document Released: 12/30/2004 Document Revised: 11/11/2011 Document Reviewed: 02/19/2010 ExitCare Patient Information 2012 [employer].   ________________________________________     To schedule your Maternity Eligibility  Appointment, please call [phone].  When you arrive for your appointment you must bring the following items or information listed below.  Your appointment will be rescheduled if you do not have these items or are 15 minutes late. If currently receiving Medicaid, you MUST bring: 1. Medicaid Card 2. Social Security Card 3. Picture ID 4. Proof of Pregnancy 5. Verification of current address if the address on Medicaid card is incorrect "postmarked mail" If not receiving Medicaid, you MUST bring: 1. Social Security Card 2. Picture ID 3. Birth Certificate (if available) Passport or *Green Card 4. Proof of Pregnancy 5. Verification of current address "postmarked mail" for each income presented. 6. Verification of insurance coverage, if any 7. Check stubs from each employer for the previous month (if unable to present check stub  for each week, we will accept check stub for the first and last week ill the same month.) If you can't locate check stubs, you must bring a letter from the employer(s) and it must have the following information on letterhead, typed, in English: o name of company o company telephone number o how long been with the company, if less than one month o how much person earns per hour o how many hours per week work o the gross pay the person earned for the previous month If you are 25 years   old or less, you do not have to bring proof of income unless you work or live with the father of the baby and at that time we will need proof of income from you and/or the father of the baby. Green Card recipients are eligible for Medicaid for Pregnant Women (MPW)   Prenatal Care Providers [facility]    [facility]  Phone- [phone]     Phone: [phone]          [facility]                      [facility]  @[place]     Phone: [phone]  Phone: [phone]         [facility]     Phone: [phone]  Phone: [phone]           [facility] [place]                hone: [phone]  Phone: [phone]         [facility] Dr[facility] Bernard Marshall      Phone: [phone]  Phone: [phone]         [facility]                Phone: [phone]  Women's [facility]   Phone:[phone]    [facility] ([place])          Phone: [phone] [facility]   Phone: [phone]  

## 2012-05-31 ENCOUNTER — Inpatient Hospital Stay (HOSPITAL_COMMUNITY)
Admission: AD | Admit: 2012-05-31 | Discharge: 2012-05-31 | Disposition: A | Discharge status: Home / Self Care | Payer: Self-pay | Source: Ambulatory Visit | Attending: Obstetrics and Gynecology | Admitting: Obstetrics and Gynecology

## 2012-05-31 ENCOUNTER — Encounter (HOSPITAL_COMMUNITY): Payer: Self-pay | Admitting: *Deleted

## 2012-05-31 DIAGNOSIS — N760 Acute vaginitis: Secondary | ICD-10-CM | POA: Insufficient documentation

## 2012-05-31 DIAGNOSIS — O2390 Unspecified genitourinary tract infection in pregnancy, unspecified trimester: Secondary | ICD-10-CM | POA: Insufficient documentation

## 2012-05-31 DIAGNOSIS — O2340 Unspecified infection of urinary tract in pregnancy, unspecified trimester: Secondary | ICD-10-CM

## 2012-05-31 DIAGNOSIS — O26859 Spotting complicating pregnancy, unspecified trimester: Secondary | ICD-10-CM | POA: Insufficient documentation

## 2012-05-31 DIAGNOSIS — A499 Bacterial infection, unspecified: Secondary | ICD-10-CM | POA: Insufficient documentation

## 2012-05-31 DIAGNOSIS — N390 Urinary tract infection, site not specified: Secondary | ICD-10-CM | POA: Insufficient documentation

## 2012-05-31 DIAGNOSIS — B9689 Other specified bacterial agents as the cause of diseases classified elsewhere: Secondary | ICD-10-CM | POA: Insufficient documentation

## 2012-05-31 DIAGNOSIS — O26851 Spotting complicating pregnancy, first trimester: Secondary | ICD-10-CM

## 2012-05-31 LAB — WET PREP, GENITAL: Yeast Wet Prep HPF POC: NONE SEEN

## 2012-05-31 LAB — URINALYSIS, ROUTINE W REFLEX MICROSCOPIC
Glucose, UA: NEGATIVE mg/dL
Ketones, ur: NEGATIVE mg/dL
Nitrite: NEGATIVE
Protein, ur: NEGATIVE mg/dL
Urobilinogen, UA: 0.2 mg/dL (ref 0.0–1.0)

## 2012-05-31 LAB — URINE MICROSCOPIC-ADD ON

## 2012-05-31 MED ORDER — METRONIDAZOLE 0.75 % VA GEL: 1.0000 | Freq: Two times a day (BID) | VAGINAL | Status: AC

## 2012-05-31 MED ORDER — METRONIDAZOLE 500 MG PO TABS: 500.0000 mg | ORAL_TABLET | Freq: Two times a day (BID) | ORAL | Status: DC

## 2012-05-31 NOTE — MAU Note (Signed)
Pt G4 P0 at 12.2wks having cramping all day and bleeding x 3 hrs.

## 2012-05-31 NOTE — Discharge Instructions (Signed)
Prenatal Care Providers °[facility]    [facility] ° Phone- [phone]     Phone: [phone] °         °[facility]                      [facility] ° @[place]     Phone: [phone] ° Phone: [phone] °        [facility] °[facility]     Phone: [phone] ° Phone: [phone]   °        [facility] °[facility] [place]                hone: [phone] ° Phone: [phone] °        [facility] °Dr. Bernard Marshall      Phone: [phone] ° Phone: [phone] °        [facility] °[facility]                Phone: [phone] ° Women’s Health  ° Phone:[phone]    [facility] ([place]) °         Phone: [phone] °[facility] °  Phone: [phone] ° ° °Bacterial Vaginosis °Bacterial vaginosis (BV) is a vaginal infection where the normal balance of bacteria in the vagina is disrupted. The normal balance is then replaced by an overgrowth of certain bacteria. There are several different kinds of bacteria that can cause BV. BV is the most common vaginal infection in women of childbearing age. °CAUSES  °· The cause of BV is not fully understood. BV develops when there is an increase or imbalance of harmful bacteria.  °· Some activities or behaviors can upset the normal balance of bacteria in the vagina and put women at increased risk including:  °· Having a new sex partner or multiple sex partners.  °· Douching.  °· Using an intrauterine device (IUD) for contraception.  °· It is not clear what role sexual activity plays in the development of BV. However, women that have never had sexual intercourse are rarely infected with BV.  °Women do not get BV from toilet seats, bedding, swimming pools or from touching objects around them.  °SYMPTOMS  °· Grey vaginal discharge.  °· A fish-like odor with discharge, especially after sexual intercourse.   °· Itching or burning of the vagina and vulva.  °· Burning or pain with urination.  °· Some women have no signs or symptoms at all.  °DIAGNOSIS  °Your caregiver must examine the vagina for signs of BV. Your caregiver will perform lab tests and look at the sample of vaginal fluid through a microscope. They will look for bacteria and abnormal cells (clue cells), a pH test higher than 4.5, and a positive amine test all associated with BV.  °RISKS AND COMPLICATIONS  °· Pelvic inflammatory disease (PID).  °· Infections following gynecology surgery.  °· Developing HIV.  °· Developing herpes virus.  °TREATMENT  °Sometimes BV will clear up without treatment. However, all women with symptoms of BV should be treated to avoid complications, especially if gynecology surgery is planned. Female partners generally do not need to be treated. However, BV may spread between female sex partners so treatment is helpful in preventing a recurrence of BV.  °· BV may be treated with antibiotics. The antibiotics come in either pill or vaginal cream forms.   Either can be used with nonpregnant or pregnant women, but the recommended dosages differ. These antibiotics are not harmful to the baby.  °· BV can recur after treatment. If this happens, a second round of antibiotics will often be prescribed.  °· Treatment is important for pregnant women. If not treated, BV can cause a premature delivery, especially for a pregnant woman who had a premature birth in the past. All pregnant women who have symptoms of BV should be checked and treated.  °· For chronic reoccurrence of BV, treatment with a type of prescribed gel vaginally twice a week is helpful.  °HOME CARE INSTRUCTIONS  °· Finish all medication as directed by your caregiver.  °· Do not have sex until treatment is completed.  °· Tell your sexual partner that you have a vaginal infection. They should see their caregiver and be treated if they have problems, such as a mild rash or itching.   °· Practice safe sex. Use condoms. Only have 1 sex partner.  °PREVENTION  °Basic prevention steps can help reduce the risk of upsetting the natural balance of bacteria in the vagina and developing BV: °· Do not have sexual intercourse (be abstinent).  °· Do not douche.  °· Use all of the medicine prescribed for treatment of BV, even if the signs and symptoms go away.  °· Tell your sex partner if you have BV. That way, they can be treated, if needed, to prevent reoccurrence.  °SEEK MEDICAL CARE IF:  °· Your symptoms are not improving after 3 days of treatment.  °· You have increased discharge, pain, or fever.  °MAKE SURE YOU:  °· Understand these instructions.  °· Will watch your condition.  °· Will get help right away if you are not doing well or get worse.  °FOR MORE INFORMATION  °Division of STD Prevention (DSTDP), Centers for Disease Control and Prevention: www.cdc.gov/std °American Social Health Association (ASHA): www.ashastd.org  °Document Released: 11/22/2005 Document Revised: 11/11/2011 Document Reviewed: 05/15/2009 °ExitCare® Patient Information ©2012 [employer].Bacterial Vaginosis °Bacterial vaginosis (BV) is a vaginal infection where the normal balance of bacteria in the vagina is disrupted. The normal balance is then replaced by an overgrowth of certain bacteria. There are several different kinds of bacteria that can cause BV. BV is the most common vaginal infection in women of childbearing age. °CAUSES  °· The cause of BV is not fully understood. BV develops when there is an increase or imbalance of harmful bacteria.  °· Some activities or behaviors can upset the normal balance of bacteria in the vagina and put women at increased risk including:  °· Having a new sex partner or multiple sex partners.  °· Douching.  °· Using an intrauterine device (IUD) for contraception.  °· It is not clear what role sexual activity plays in the development of BV. However, women that have never had sexual intercourse  are rarely infected with BV.  °Women do not get BV from toilet seats, bedding, swimming pools or from touching objects around them.  °SYMPTOMS  °· Grey vaginal discharge.  °· A fish-like odor with discharge, especially after sexual intercourse.  °· Itching or burning of the vagina and vulva.  °· Burning or pain with urination.  °· Some women have no signs or symptoms at all.  °DIAGNOSIS  °Your caregiver must examine the vagina for signs of BV. Your caregiver will perform lab tests and look at the sample of vaginal fluid through a microscope. They will look for bacteria and abnormal   cells (clue cells), a pH test higher than 4.5, and a positive amine test all associated with BV.  °RISKS AND COMPLICATIONS  °· Pelvic inflammatory disease (PID).  °· Infections following gynecology surgery.  °· Developing HIV.  °· Developing herpes virus.  °TREATMENT  °Sometimes BV will clear up without treatment. However, all women with symptoms of BV should be treated to avoid complications, especially if gynecology surgery is planned. Female partners generally do not need to be treated. However, BV may spread between female sex partners so treatment is helpful in preventing a recurrence of BV.  °· BV may be treated with antibiotics. The antibiotics come in either pill or vaginal cream forms. Either can be used with nonpregnant or pregnant women, but the recommended dosages differ. These antibiotics are not harmful to the baby.  °· BV can recur after treatment. If this happens, a second round of antibiotics will often be prescribed.  °· Treatment is important for pregnant women. If not treated, BV can cause a premature delivery, especially for a pregnant woman who had a premature birth in the past. All pregnant women who have symptoms of BV should be checked and treated.  °· For chronic reoccurrence of BV, treatment with a type of prescribed gel vaginally twice a week is helpful.  °HOME CARE INSTRUCTIONS  °· Finish all medication as  directed by your caregiver.  °· Do not have sex until treatment is completed.  °· Tell your sexual partner that you have a vaginal infection. They should see their caregiver and be treated if they have problems, such as a mild rash or itching.  °· Practice safe sex. Use condoms. Only have 1 sex partner.  °PREVENTION  °Basic prevention steps can help reduce the risk of upsetting the natural balance of bacteria in the vagina and developing BV: °· Do not have sexual intercourse (be abstinent).  °· Do not douche.  °· Use all of the medicine prescribed for treatment of BV, even if the signs and symptoms go away.  °· Tell your sex partner if you have BV. That way, they can be treated, if needed, to prevent reoccurrence.  °SEEK MEDICAL CARE IF:  °· Your symptoms are not improving after 3 days of treatment.  °· You have increased discharge, pain, or fever.  °MAKE SURE YOU:  °· Understand these instructions.  °· Will watch your condition.  °· Will get help right away if you are not doing well or get worse.  °FOR MORE INFORMATION  °Division of STD Prevention (DSTDP), Centers for Disease Control and Prevention: www.cdc.gov/std °American Social Health Association (ASHA): www.ashastd.org  °Document Released: 11/22/2005 Document Revised: 11/11/2011 Document Reviewed: 05/15/2009 °ExitCare® Patient Information ©2012 [employer]. °

## 2012-05-31 NOTE — MAU Provider Note (Signed)
Madison M Cornelius26 y.o.G4P0030 @[redacted]w[redacted]d  by LMP Chief Complaint  Patient presents with  . Abdominal Cramping  . Vaginal Bleeding     First Provider Initiated Contact with Patient 05/31/12 2219      SUBJECTIVE  HPI: HPI: Madison [name] is a 26 y.o. year old [id number] female at [redacted]w[redacted]d weeks gestation who presents to MAU reporting  1. VB x 3 days lighter than a period and severe constipation.  2. Few small BMs in past two weeks.  3. Mild cramping.  4. Frequency  Has been evaluated for spotting earlier this pregnancy. IUP verified. Hx 3 SABs. Nervious. Denies dysuria, fever, flank pain, passing clots or tissue.    Past Medical History  Diagnosis Date  . No pertinent past medical history    Past Surgical History  Procedure Date  . Dilation and curettage of uterus    History   Social History  . Marital Status: Single    Spouse Name: N/A    Number of Children: N/A  . Years of Education: N/A   Occupational History  . Not on file.   Social History Main Topics  . Smoking status: Never Smoker   . Smokeless tobacco: Not on file  . Alcohol Use: Yes     on weekends/ stopped with knowledge of pregnancy   . Drug Use: No  . Sexually Active: Yes    Birth Control/ Protection: Condom   Other Topics Concern  . Not on file   Social History Narrative  . No narrative on file   No current facility-administered medications on file prior to encounter.   Current Outpatient Prescriptions on File Prior to Encounter  Medication Sig Dispense Refill  . Prenatal Vit-Fe Fumarate-FA (PRENATAL MULTIVITAMIN) TABS Take 1 tablet by mouth every morning.        No Known Allergies  ROS: Pertinent items in HPI  OBJECTIVE Blood pressure 118/73, pulse 88, temperature 99.1 F (37.3 C), temperature source Oral, resp. rate 16, height 5' 5.5" (1.664 m), weight 70.489 kg (155 lb 6.4 oz), last menstrual period 03/06/2012.  GENERAL: Well-developed, well-nourished female in no acute distress.  HEENT:  Normocephalic, good dentition HEART: normal rate RESP: normal effort ABDOMEN: Soft, nontender EXTREMITIES: Nontender, no edema NEURO: Alert and oriented SPECULUM EXAM: NEFG, physiologic discharge, scant tan blood noted, cervix clean BIMANUAL: cervix closed; uterus 12 week size; no adnexal tenderness or masses FHR 156 by doppler.  LAB RESULTS Results for orders placed during the hospital encounter of 05/31/12 (from the past 72 hour(s))  URINALYSIS, ROUTINE W REFLEX MICROSCOPIC     Status: Abnormal   Collection Time   05/31/12  9:45 PM      Component Value Range Comment   Color, Urine YELLOW  YELLOW    APPearance CLEAR  CLEAR    Specific Gravity, Urine <1.005 (*) 1.005 - 1.030    pH 7.0  5.0 - 8.0    Glucose, UA NEGATIVE  NEGATIVE mg/dL    Hgb urine dipstick SMALL (*) NEGATIVE    Bilirubin Urine NEGATIVE  NEGATIVE    Ketones, ur NEGATIVE  NEGATIVE mg/dL    Protein, ur NEGATIVE  NEGATIVE mg/dL    Urobilinogen, UA 0.2  0.0 - 1.0 mg/dL    Nitrite NEGATIVE  NEGATIVE    Leukocytes, UA MODERATE (*) NEGATIVE   URINE MICROSCOPIC-ADD ON     Status: Abnormal   Collection Time   05/31/12  9:45 PM      Component Value Range Comment   Squamous Epithelial /  LPF FEW (*) RARE    WBC, UA 7-10  <3 WBC/hpf    RBC / HPF 3-6  <3 RBC/hpf    Bacteria, UA FEW (*) RARE   URINE CULTURE     Status: Normal   Collection Time   05/31/12  9:45 PM      Component Value Range Comment    pending      WET PREP, GENITAL     Status: Abnormal   Collection Time   05/31/12 10:25 PM      Component Value Range Comment   Yeast Wet Prep HPF POC NONE SEEN  NONE SEEN    Trich, Wet Prep NONE SEEN  NONE SEEN    Clue Cells Wet Prep HPF POC FEW (*) NONE SEEN    WBC, Wet Prep HPF POC MODERATE (*) NONE SEEN MODERATE BACTERIA SEEN  GC/CHLAMYDIA PROBE AMP, GENITAL     Status: Normal   Collection Time   05/31/12 10:25 PM      Component Value Range Comment   GC Probe Amp, Genital NEGATIVE  NEGATIVE    Chlamydia, DNA Probe  NEGATIVE  NEGATIVE    IMAGING N/A  ASSESSMENT 1. Spotting in first trimester   2. UTI (urinary tract infection) during pregnancy   3. BV (bacterial vaginosis)    PLAN DC home Bleeding precautions Pelvic rest x1 week Medication List  As of 06/02/2012  3:42 AM   TAKE these medications         metroNIDAZOLE 0.75 % vaginal gel   Commonly known as: METROGEL   Place 1 Applicatorful vaginally 2 (two) times daily.      ondansetron 8 MG disintegrating tablet   Commonly known as: ZOFRAN-ODT   Take 8 mg by mouth every 8 (eight) hours as needed. For nausea      prenatal multivitamin Tabs   Take 1 tablet by mouth every morning.           Follow-up Information    Please follow up. (Start prenatal care)       Follow up with [facility]. (As needed if symptoms worsen)    Contact information:   [address] [place] [address] [phone]        Dorathy Kinsman 05/31/2012 10:19 PM

## 2012-06-02 LAB — URINE CULTURE: Colony Count: 2000

## 2012-06-07 ENCOUNTER — Other Ambulatory Visit: Payer: Self-pay | Admitting: Obstetrics and Gynecology

## 2012-06-10 NOTE — MAU Provider Note (Signed)
Attestation of Attending Supervision of Advanced Practitioner: Evaluation and management procedures were performed by the PA/NP/CNM/OB Fellow under my supervision/collaboration. Chart reviewed and agree with management and plan.  Madison [name] V 06/10/2012 5:46 AM    

## 2013-02-15 ENCOUNTER — Encounter (HOSPITAL_COMMUNITY): Payer: Self-pay | Admitting: *Deleted

## 2013-02-15 ENCOUNTER — Inpatient Hospital Stay (HOSPITAL_COMMUNITY)
Admission: AD | Admit: 2013-02-15 | Discharge: 2013-02-16 | Disposition: A | Discharge status: Home / Self Care | Payer: Medicaid Other | Source: Ambulatory Visit | Attending: Obstetrics and Gynecology | Admitting: Obstetrics and Gynecology

## 2013-02-15 DIAGNOSIS — R109 Unspecified abdominal pain: Secondary | ICD-10-CM | POA: Insufficient documentation

## 2013-02-15 DIAGNOSIS — N949 Unspecified condition associated with female genital organs and menstrual cycle: Secondary | ICD-10-CM | POA: Insufficient documentation

## 2013-02-15 DIAGNOSIS — N736 Female pelvic peritoneal adhesions (postinfective): Secondary | ICD-10-CM | POA: Insufficient documentation

## 2013-02-15 LAB — URINALYSIS, ROUTINE W REFLEX MICROSCOPIC
Hgb urine dipstick: NEGATIVE
Nitrite: NEGATIVE
Specific Gravity, Urine: 1.02 (ref 1.005–1.030)
Urobilinogen, UA: 0.2 mg/dL (ref 0.0–1.0)
pH: 6.5 (ref 5.0–8.0)

## 2013-02-15 NOTE — MAU Note (Signed)
Pt states she had a C-section in [place] on 12/09/12-moved here 1 month ago and has not been seen by a MD-sttes earlier in the day she had abd pain and took 800mg  motrin at 1930 and feels better but wants to "checked ou"-states she still has some pain in her lower abd

## 2013-02-16 ENCOUNTER — Encounter (HOSPITAL_COMMUNITY): Payer: Self-pay | Admitting: *Deleted

## 2013-02-16 LAB — WET PREP, GENITAL
Trich, Wet Prep: NONE SEEN
Yeast Wet Prep HPF POC: NONE SEEN

## 2013-02-16 LAB — GC/CHLAMYDIA PROBE AMP: CT Probe RNA: NEGATIVE

## 2013-02-16 NOTE — MAU Provider Note (Signed)
History     CSN: [id number]  Arrival date and time: 02/15/13 2004   None     Chief Complaint  Patient presents with  . Abdominal Pain   HPI  Madison [name] is a 27 y.o.. She states she had a c-section in January in [place]. Today while she was climbing stairs she felt a lot of discomfort. She states she took advil and the pain is better now, but she is also having a brown-yellow vaginal discharge. She would like to have it "checked out".   Past Medical History  Diagnosis Date  . No pertinent past medical history     Past Surgical History  Procedure Laterality Date  . Dilation and curettage of uterus      Family History  Problem Relation Age of Onset  . Parkinsonism Other   . Diabetes Other   . Schizophrenia Other   . Anesthesia problems Neg Hx     History  Substance Use Topics  . Smoking status: Never Smoker   . Smokeless tobacco: Not on file  . Alcohol Use: Yes     Comment: on weekends/ stopped with knowledge of pregnancy     Allergies:  Allergies  Allergen Reactions  . Penicillins Hives    8 months ago    Prescriptions prior to admission  Medication Sig Dispense Refill  . Prenatal Vit-Fe Fumarate-FA (PRENATAL MULTIVITAMIN) TABS Take 1 tablet by mouth every morning.       . ondansetron (ZOFRAN-ODT) 8 MG disintegrating tablet Take 8 mg by mouth every 8 (eight) hours as needed. For nausea        Review of Systems  Constitutional: Negative for fever.  Gastrointestinal: Negative for nausea, vomiting, abdominal pain, diarrhea and constipation.  Genitourinary: Negative for dysuria.  Neurological: Negative for dizziness.   Physical Exam   Blood pressure 125/51, pulse 72, temperature 98.9 F (37.2 C), temperature source Oral, resp. rate 20, height 5\' 6"  (1.676 m), weight 155 lb 4 oz (70.421 kg), last menstrual period 02/08/2013, SpO2 100.00%, unknown if currently breastfeeding.  Physical Exam  Nursing note and vitals reviewed. Constitutional: She is  oriented to person, place, and time. She appears well-developed and well-nourished. No distress.  Cardiovascular: Normal rate.   Respiratory: Effort normal.  GI: Soft.  Incision: clean, dry, intact. Well healed, no evidence of infection.   Neurological: She is alert and oriented to person, place, and time.  Skin: Skin is warm and dry.    MAU Course  Procedures  Results for orders placed during the hospital encounter of 02/15/13 (from the past 24 hour(s))  URINALYSIS, ROUTINE W REFLEX MICROSCOPIC     Status: None   Collection Time    02/15/13  9:00 PM      Result Value Range   Color, Urine YELLOW  YELLOW   APPearance CLEAR  CLEAR   Specific Gravity, Urine 1.020  1.005 - 1.030   pH 6.5  5.0 - 8.0   Glucose, UA NEGATIVE  NEGATIVE mg/dL   Hgb urine dipstick NEGATIVE  NEGATIVE   Bilirubin Urine NEGATIVE  NEGATIVE   Ketones, ur NEGATIVE  NEGATIVE mg/dL   Protein, ur NEGATIVE  NEGATIVE mg/dL   Urobilinogen, UA 0.2  0.0 - 1.0 mg/dL   Nitrite NEGATIVE  NEGATIVE   Leukocytes, UA NEGATIVE  NEGATIVE  WET PREP, GENITAL     Status: Abnormal   Collection Time    02/16/13  1:11 AM      Result Value Range   Yeast  Wet Prep HPF POC NONE SEEN  NONE SEEN   Trich, Wet Prep NONE SEEN  NONE SEEN   Clue Cells Wet Prep HPF POC FEW (*) NONE SEEN   WBC, Wet Prep HPF POC FEW (*) NONE SEEN   Assessment and Plan   1. Pelvic adhesions    Reviewed danger signs and comfort measures FU as needed Find a GYN to continue care.   Tawnya Crook 02/16/2013, 1:05 AM

## 2013-02-16 NOTE — MAU Provider Note (Signed)
Attestation of Attending Supervision of Advanced Practitioner (CNM/NP): Evaluation and management procedures were performed by the Advanced Practitioner under my supervision and collaboration.  I have reviewed the Advanced Practitioner's note and chart, and I agree with the management and plan.  CONSTANT,PEGGY 02/16/2013 8:16 AM   

## 2013-05-03 ENCOUNTER — Emergency Department (HOSPITAL_COMMUNITY): Payer: Self-pay

## 2013-05-03 ENCOUNTER — Emergency Department (HOSPITAL_COMMUNITY)
Admission: EM | Admit: 2013-05-03 | Discharge: 2013-05-03 | Disposition: A | Discharge status: Home / Self Care | Payer: Self-pay | Attending: Emergency Medicine | Admitting: Emergency Medicine

## 2013-05-03 ENCOUNTER — Encounter (HOSPITAL_COMMUNITY): Payer: Self-pay | Admitting: Emergency Medicine

## 2013-05-03 ENCOUNTER — Inpatient Hospital Stay (HOSPITAL_COMMUNITY)
Admission: AD | Admit: 2013-05-03 | Discharge: 2013-05-03 | Disposition: A | Discharge status: Home / Self Care | Payer: Self-pay | Source: Ambulatory Visit | Attending: Family Medicine | Admitting: Family Medicine

## 2013-05-03 DIAGNOSIS — R000 Tachycardia, unspecified: Secondary | ICD-10-CM | POA: Insufficient documentation

## 2013-05-03 DIAGNOSIS — R102 Pelvic and perineal pain: Secondary | ICD-10-CM

## 2013-05-03 DIAGNOSIS — Z3202 Encounter for pregnancy test, result negative: Secondary | ICD-10-CM | POA: Insufficient documentation

## 2013-05-03 DIAGNOSIS — R109 Unspecified abdominal pain: Secondary | ICD-10-CM | POA: Insufficient documentation

## 2013-05-03 DIAGNOSIS — M549 Dorsalgia, unspecified: Secondary | ICD-10-CM | POA: Insufficient documentation

## 2013-05-03 DIAGNOSIS — N949 Unspecified condition associated with female genital organs and menstrual cycle: Secondary | ICD-10-CM | POA: Insufficient documentation

## 2013-05-03 LAB — URINALYSIS, ROUTINE W REFLEX MICROSCOPIC
Bilirubin Urine: NEGATIVE
Bilirubin Urine: NEGATIVE
Hgb urine dipstick: NEGATIVE
Hgb urine dipstick: NEGATIVE
Ketones, ur: NEGATIVE mg/dL
Leukocytes, UA: NEGATIVE
Nitrite: NEGATIVE
Protein, ur: NEGATIVE mg/dL
Specific Gravity, Urine: 1.015 (ref 1.005–1.030)
Specific Gravity, Urine: 1.01 (ref 1.005–1.030)
Urobilinogen, UA: 1 mg/dL (ref 0.0–1.0)
pH: 7.5 (ref 5.0–8.0)

## 2013-05-03 LAB — CBC WITH DIFFERENTIAL/PLATELET
Basophils Absolute: 0 10*3/uL (ref 0.0–0.1)
Eosinophils Relative: 0 % (ref 0–5)
Lymphocytes Relative: 14 % (ref 12–46)
Lymphs Abs: 1.2 10*3/uL (ref 0.7–4.0)
Neutro Abs: 7.2 10*3/uL (ref 1.7–7.7)
Neutrophils Relative %: 79 % — ABNORMAL HIGH (ref 43–77)
Platelets: 248 10*3/uL (ref 150–400)
RBC: 4.21 MIL/uL (ref 3.87–5.11)
RDW: 13 % (ref 11.5–15.5)
WBC: 9.1 10*3/uL (ref 4.0–10.5)

## 2013-05-03 LAB — BASIC METABOLIC PANEL
CO2: 26 mEq/L (ref 19–32)
Calcium: 9.4 mg/dL (ref 8.4–10.5)
Chloride: 98 mEq/L (ref 96–112)
Glucose, Bld: 117 mg/dL — ABNORMAL HIGH (ref 70–99)
Potassium: 3.8 mEq/L (ref 3.5–5.1)
Sodium: 134 mEq/L — ABNORMAL LOW (ref 135–145)

## 2013-05-03 LAB — URINE MICROSCOPIC-ADD ON

## 2013-05-03 LAB — POCT PREGNANCY, URINE
Preg Test, Ur: NEGATIVE
Preg Test, Ur: NEGATIVE

## 2013-05-03 MED ORDER — MORPHINE SULFATE 4 MG/ML IJ SOLN
4.0000 mg | Freq: Once | INTRAMUSCULAR | Status: AC
  Administered 2013-05-03: 4 mg via INTRAVENOUS
  Filled 2013-05-03: qty 1

## 2013-05-03 MED ORDER — OXYCODONE-ACETAMINOPHEN 5-325 MG PO TABS
2.0000 | ORAL_TABLET | Freq: Once | ORAL | Status: AC
  Administered 2013-05-03: 2 via ORAL
  Filled 2013-05-03: qty 2

## 2013-05-03 MED ORDER — IOHEXOL 300 MG/ML  SOLN
80.0000 mL | Freq: Once | INTRAMUSCULAR | Status: AC | PRN
  Administered 2013-05-03: 80 mL via INTRAVENOUS

## 2013-05-03 MED ORDER — IBUPROFEN 600 MG PO TABS: 600.0000 mg | ORAL_TABLET | Freq: Four times a day (QID) | ORAL | Status: DC | PRN

## 2013-05-03 MED ORDER — IOHEXOL 300 MG/ML  SOLN
50.0000 mL | Freq: Once | INTRAMUSCULAR | Status: AC | PRN
  Administered 2013-05-03: 50 mL via ORAL

## 2013-05-03 MED ORDER — KETOROLAC TROMETHAMINE 30 MG/ML IJ SOLN
30.0000 mg | Freq: Once | INTRAMUSCULAR | Status: AC
  Administered 2013-05-03: 30 mg via INTRAVENOUS
  Filled 2013-05-03: qty 1

## 2013-05-03 MED ORDER — OXYCODONE-ACETAMINOPHEN 5-325 MG PO TABS: 2.0000 | ORAL_TABLET | ORAL | Status: DC | PRN

## 2013-05-03 NOTE — ED Notes (Signed)
Pt states this morning she began having abdominal pain that radiates to lower back.

## 2013-05-03 NOTE — MAU Note (Signed)
Patient states she had a cesarean section in January. States she started having pain in the lower abdomen at the scar line and back pain after intercourse this am. Denies bleeding or discharge.

## 2013-05-03 NOTE — ED Notes (Signed)
ZOX:[id number]<AV> Expected date:<BR> Expected time:<BR> Means of arrival:<BR> Comments:<BR> Hold for [facility]

## 2013-05-03 NOTE — ED Provider Notes (Signed)
History     CSN: [id number]  Arrival date & time 05/03/13  1641   First MD Initiated Contact with Patient 05/03/13 1656      Chief Complaint  Patient presents with  . Abdominal Pain    (Consider location/radiation/quality/duration/timing/severity/associated sxs/prior treatment) Patient is a 27 y.o. female presenting with abdominal pain. The history is provided by the patient.  Abdominal Pain This is a new problem. The current episode started 6 to 12 hours ago. The problem occurs constantly. The problem has not changed since onset.Associated symptoms include abdominal pain. The symptoms are aggravated by intercourse. Nothing relieves the symptoms.  pt sx started after intercourse--no vag bleeding or d/c--no dysuria or hematura--no tx used pta  Past Medical History  Diagnosis Date  . No pertinent past medical history     Past Surgical History  Procedure Laterality Date  . Dilation and curettage of uterus      Family History  Problem Relation Age of Onset  . Parkinsonism Other   . Diabetes Other   . Schizophrenia Other   . Anesthesia problems Neg Hx     History  Substance Use Topics  . Smoking status: Never Smoker   . Smokeless tobacco: Not on file  . Alcohol Use: Yes     Comment: on weekends/ stopped with knowledge of pregnancy     OB History   Grav Para Term Preterm Abortions TAB SAB Ect Mult Living   4 0 0 0 3 0 3 0 0 1       Review of Systems  Gastrointestinal: Positive for abdominal pain.  All other systems reviewed and are negative.    Allergies  Penicillins  Home Medications  No current outpatient prescriptions on file.  BP 111/81  Pulse 106  Temp(Src) 99.7 F (37.6 C) (Oral)  Resp 22  SpO2 97%  LMP 03/07/2013  Physical Exam  Nursing note and vitals reviewed. Constitutional: She is oriented to person, place, and time. She appears well-developed and well-nourished.  Non-toxic appearance. No distress.  HENT:  Head: Normocephalic and  atraumatic.  Eyes: Conjunctivae, EOM and lids are normal. Pupils are equal, round, and reactive to light.  Neck: Normal range of motion. Neck supple. No tracheal deviation present. No mass present.  Cardiovascular: Regular rhythm and normal heart sounds.  Tachycardia present.  Exam reveals no gallop.   No murmur heard. Pulmonary/Chest: Effort normal and breath sounds normal. No stridor. No respiratory distress. She has no decreased breath sounds. She has no wheezes. She has no rhonchi. She has no rales.  Abdominal: Soft. Normal appearance and bowel sounds are normal. She exhibits no distension. There is tenderness in the right lower quadrant and suprapubic area. There is no rigidity, no rebound, no guarding and no CVA tenderness.  Genitourinary: Right adnexum displays tenderness. No tenderness around the vagina. No vaginal discharge found.  Musculoskeletal: Normal range of motion. She exhibits no edema and no tenderness.  Neurological: She is alert and oriented to person, place, and time. She has normal strength. No cranial nerve deficit or sensory deficit. GCS eye subscore is 4. GCS verbal subscore is 5. GCS motor subscore is 6.  Skin: Skin is warm and dry. No abrasion and no rash noted.  Psychiatric: She has a normal mood and affect. Her speech is normal and behavior is normal.    ED Course  Procedures (including critical care time)  Labs Reviewed  WET PREP, GENITAL  GC/CHLAMYDIA PROBE AMP  URINALYSIS, ROUTINE W REFLEX MICROSCOPIC   No  results found.   No diagnosis found.    MDM  Pt sx started after intercourse and workup here including ct and u/s neg, no evidence of acute abd at this time, pain meds given and pt feels better, stable for d/c        Toy Baker, MD 05/03/13 2216

## 2013-05-05 LAB — GC/CHLAMYDIA PROBE AMP
CT Probe RNA: POSITIVE — AB
GC Probe RNA: NEGATIVE

## 2013-05-06 ENCOUNTER — Telehealth (HOSPITAL_COMMUNITY): Payer: Self-pay | Admitting: Emergency Medicine

## 2013-05-06 NOTE — ED Notes (Signed)
+  Chlamydia. Chart sent to EDP office for review. [employer] attached. 

## 2013-05-06 NOTE — ED Notes (Signed)
Patient has +Chlamydia. 

## 2013-05-06 NOTE — ED Notes (Signed)
+  Chalmydia. Per [facility] PA-C, give Azithromycin 1 gram PO x once.

## 2013-10-01 ENCOUNTER — Encounter (HOSPITAL_COMMUNITY): Payer: Self-pay | Admitting: Emergency Medicine

## 2013-10-01 ENCOUNTER — Emergency Department (HOSPITAL_COMMUNITY)
Admission: EM | Admit: 2013-10-01 | Discharge: 2013-10-01 | Discharge status: Against Medical Advice | Payer: Medicaid Other | Attending: Emergency Medicine | Admitting: Emergency Medicine

## 2013-10-01 DIAGNOSIS — Z3201 Encounter for pregnancy test, result positive: Secondary | ICD-10-CM | POA: Insufficient documentation

## 2013-10-01 LAB — POCT PREGNANCY, URINE: Preg Test, Ur: POSITIVE — AB

## 2013-10-01 NOTE — ED Notes (Signed)
Pt here requesting ultrasound for pregnancy confirmation; pt sts LMP was 08/22/13; pt denies pain or bleeding; pt sts G5 P1 M3

## 2013-10-01 NOTE — ED Notes (Signed)
Called pt twice for recheck of vital signs no answer.

## 2013-10-01 NOTE — ED Notes (Signed)
Pt did not respond to being called from waiting room x 1

## 2013-10-07 ENCOUNTER — Other Ambulatory Visit (HOSPITAL_COMMUNITY): Payer: Medicaid Other

## 2013-10-07 ENCOUNTER — Encounter (HOSPITAL_COMMUNITY): Payer: Self-pay | Admitting: *Deleted

## 2013-10-07 ENCOUNTER — Inpatient Hospital Stay (HOSPITAL_COMMUNITY)
Admission: AD | Admit: 2013-10-07 | Discharge: 2013-10-07 | Disposition: A | Discharge status: Home / Self Care | Payer: Medicaid Other | Source: Ambulatory Visit | Attending: Obstetrics & Gynecology | Admitting: Obstetrics & Gynecology

## 2013-10-07 ENCOUNTER — Inpatient Hospital Stay (HOSPITAL_COMMUNITY): Payer: Medicaid Other

## 2013-10-07 DIAGNOSIS — O26899 Other specified pregnancy related conditions, unspecified trimester: Secondary | ICD-10-CM

## 2013-10-07 DIAGNOSIS — O99891 Other specified diseases and conditions complicating pregnancy: Secondary | ICD-10-CM | POA: Insufficient documentation

## 2013-10-07 DIAGNOSIS — R109 Unspecified abdominal pain: Secondary | ICD-10-CM | POA: Insufficient documentation

## 2013-10-07 LAB — URINALYSIS, ROUTINE W REFLEX MICROSCOPIC
Glucose, UA: NEGATIVE mg/dL
Ketones, ur: NEGATIVE mg/dL
Leukocytes, UA: NEGATIVE
Nitrite: NEGATIVE
pH: 6 (ref 5.0–8.0)

## 2013-10-07 LAB — CBC WITH DIFFERENTIAL/PLATELET
Eosinophils Relative: 1 % (ref 0–5)
HCT: 36 % (ref 36.0–46.0)
Hemoglobin: 13 g/dL (ref 12.0–15.0)
Lymphocytes Relative: 32 % (ref 12–46)
MCV: 87 fL (ref 78.0–100.0)
Monocytes Absolute: 0.4 10*3/uL (ref 0.1–1.0)
Monocytes Relative: 8 % (ref 3–12)
Neutro Abs: 2.6 10*3/uL (ref 1.7–7.7)
WBC: 4.5 10*3/uL (ref 4.0–10.5)

## 2013-10-07 LAB — WET PREP, GENITAL: Trich, Wet Prep: NONE SEEN

## 2013-10-07 LAB — POCT PREGNANCY, URINE: Preg Test, Ur: POSITIVE — AB

## 2013-10-07 MED ORDER — PROMETHAZINE HCL 25 MG PO TABS: 25.0000 mg | ORAL_TABLET | Freq: Four times a day (QID) | ORAL | Status: DC | PRN

## 2013-10-07 NOTE — MAU Provider Note (Signed)
History     CSN: [id number]  Arrival date and time: 10/07/13 1236   First Provider Initiated Contact with Patient 10/07/13 1259      Chief Complaint  Patient presents with  . Nausea  . Possible Pregnancy  . Abdominal Cramping   HPI Comments: Madison [name] 27 y.o. [id number] presents to MAU with cramping in early pregnancy. She has had positive preg test but no ultrasound. By her LMP she is 6 weeks and 4 days. Hx 3 SAB's. Denies any LOF or vaginal bleeding. Had sexual intercourse today and pain started after. Had same yesterday with walking. No medications taken for pain. Did take Dramamine for nausea and would like something stronger.       Patient is a 27 y.o. female presenting with pregnancy problem and cramps.  Possible Pregnancy Associated symptoms include nausea.  Abdominal Cramping The primary symptoms of the illness include nausea.      Past Medical History  Diagnosis Date  . No pertinent past medical history     Past Surgical History  Procedure Laterality Date  . Dilation and curettage of uterus      Family History  Problem Relation Age of Onset  . Parkinsonism Other   . Diabetes Other   . Schizophrenia Other   . Anesthesia problems Neg Hx     History  Substance Use Topics  . Smoking status: Never Smoker   . Smokeless tobacco: Not on file  . Alcohol Use: Yes     Comment: on weekends/ stopped with knowledge of pregnancy     Allergies:  Allergies  Allergen Reactions  . Penicillins Hives    8 months ago    Prescriptions prior to admission  Medication Sig Dispense Refill  . ibuprofen (ADVIL,MOTRIN) 600 MG tablet Take 1 tablet (600 mg total) by mouth every 6 (six) hours as needed for pain.  30 tablet  0  . oxyCODONE-acetaminophen (PERCOCET/ROXICET) 5-325 MG per tablet Take 2 tablets by mouth every 4 (four) hours as needed for pain.  10 tablet  0    Review of Systems  Constitutional: Negative.   HENT: Negative.   Eyes: Negative.    Respiratory: Negative.   Cardiovascular: Negative.   Gastrointestinal: Positive for nausea.       Pelvic cramping  Musculoskeletal: Negative.   Skin: Negative.   Neurological: Negative.   Psychiatric/Behavioral: Negative.    Physical Exam   Blood pressure 123/72, pulse 81, temperature 98.3 F (36.8 C), resp. rate 18, last menstrual period 08/22/2013.  Physical Exam  Constitutional: She is oriented to person, place, and time. She appears well-developed and well-nourished. No distress.  HENT:  Head: Normocephalic and atraumatic.  Eyes: Pupils are equal, round, and reactive to light.  Cardiovascular: Normal rate, regular rhythm and normal heart sounds.   Respiratory: Effort normal and breath sounds normal. No respiratory distress.  GI: Soft. Bowel sounds are normal. She exhibits no distension. There is no tenderness.  Genitourinary: Vagina normal and uterus normal. No vaginal discharge found.  Cervix: long and closed  Musculoskeletal: Normal range of motion.  Neurological: She is alert and oriented to person, place, and time.  Skin: Skin is warm and dry.  Psychiatric: She has a normal mood and affect. Her behavior is normal. Judgment and thought content normal.   Results for orders placed during the hospital encounter of 10/07/13 (from the past 24 hour(s))  URINALYSIS, ROUTINE W REFLEX MICROSCOPIC     Status: None   Collection Time  10/07/13 12:40 PM      Result Value Range   Color, Urine YELLOW  YELLOW   APPearance CLEAR  CLEAR   Specific Gravity, Urine 1.025  1.005 - 1.030   pH 6.0  5.0 - 8.0   Glucose, UA NEGATIVE  NEGATIVE mg/dL   Hgb urine dipstick NEGATIVE  NEGATIVE   Bilirubin Urine NEGATIVE  NEGATIVE   Ketones, ur NEGATIVE  NEGATIVE mg/dL   Protein, ur NEGATIVE  NEGATIVE mg/dL   Urobilinogen, UA 0.2  0.0 - 1.0 mg/dL   Nitrite NEGATIVE  NEGATIVE   Leukocytes, UA NEGATIVE  NEGATIVE  POCT PREGNANCY, URINE     Status: Abnormal   Collection Time    10/07/13 12:47  PM      Result Value Range   Preg Test, Ur POSITIVE (*) NEGATIVE  WET PREP, GENITAL     Status: Abnormal   Collection Time    10/07/13  1:15 PM      Result Value Range   Yeast Wet Prep HPF POC NONE SEEN  NONE SEEN   Trich, Wet Prep NONE SEEN  NONE SEEN   Clue Cells Wet Prep HPF POC NONE SEEN  NONE SEEN   WBC, Wet Prep HPF POC FEW (*) NONE SEEN  CBC WITH DIFFERENTIAL     Status: Abnormal   Collection Time    10/07/13  1:30 PM      Result Value Range   WBC 4.5  4.0 - 10.5 K/uL   RBC 4.14  3.87 - 5.11 MIL/uL   Hemoglobin 13.0  12.0 - 15.0 g/dL   HCT 81.1  91.4 - 78.2 %   MCV 87.0  78.0 - 100.0 fL   MCH 31.4  26.0 - 34.0 pg   MCHC 36.1 (*) 30.0 - 36.0 g/dL   RDW 95.6  21.3 - 08.6 %   Platelets 231  150 - 400 K/uL   Neutrophils Relative % 58  43 - 77 %   Neutro Abs 2.6  1.7 - 7.7 K/uL   Lymphocytes Relative 32  12 - 46 %   Lymphs Abs 1.5  0.7 - 4.0 K/uL   Monocytes Relative 8  3 - 12 %   Monocytes Absolute 0.4  0.1 - 1.0 K/uL   Eosinophils Relative 1  0 - 5 %   Eosinophils Absolute 0.1  0.0 - 0.7 K/uL   Basophils Relative 0  0 - 1 %   Basophils Absolute 0.0  0.0 - 0.1 K/uL  HCG, QUANTITATIVE, PREGNANCY     Status: Abnormal   Collection Time    10/07/13  1:30 PM      Result Value Range   hCG, Beta Chain, Quant, S [id number] (*) <5 mIU/mL  ABO/RH     Status: None   Collection Time    10/07/13  1:30 PM      Result Value Range   ABO/RH(D) A POS     US Ob Comp Less 14 Wks  10/07/2013   CLINICAL DATA:  Pelvic pain and cramping. On uncertain LMP. Previous molar pregnancy.  EXAM: OBSTETRIC <14 WK ULTRASOUND  TECHNIQUE: Transabdominal ultrasound was performed for evaluation of the gestation as well as the maternal uterus and adnexal regions.  COMPARISON:  None.  FINDINGS: Intrauterine gestational sac: Visualized/normal in shape.  Yolk sac:  Visualized  Embryo:  Visualized  Cardiac Activity: Visualized  Heart Rate: 111 bpm  CRL:   5  mm   6 w 3 d  [place] EDC: 05/30/2014   Maternal uterus/adnexae: Both ovaries are normal appearance. No adnexal mass or free fluid identified.  IMPRESSION: Single living IUP measuring 6 weeks 3 days with [place] EDC of 05/30/2014.  No significant maternal uterine or adnexal abnormality identified.   Electronically Signed   By: Myles Rosenthal M.D.   On: 10/07/2013 14:16     MAU Course  Procedures  MDM  Wet Prep, GC/ Chlamydia, CBC, U/S, Quant, ABORh  Assessment and Plan   A: Pain in early pregnancy  P: Above tests done Advised to establish prenatal care Phenergan 25 mg q 6 hrs prn Start PNV daily   Carolynn Serve 10/07/2013, 1:28 PM

## 2013-10-07 NOTE — MAU Provider Note (Signed)
Attestation of Attending Supervision of Advanced Practitioner (CNM/NP): Evaluation and management procedures were performed by the Advanced Practitioner under my supervision and collaboration.  I have reviewed the Advanced Practitioner's note and chart, and I agree with the management and plan.  HARRAWAY-SMITH, Ammara Raj 5:44 PM     

## 2013-10-07 NOTE — MAU Note (Signed)
Pt presents with complaints of abdominal cramping and nausea. She says she took a HPT and it was positive 1 week ago  Denies any vaginal bleeding

## 2013-10-08 LAB — GC/CHLAMYDIA PROBE AMP: GC Probe RNA: NEGATIVE

## 2013-11-07 ENCOUNTER — Emergency Department: Payer: Self-pay | Admitting: Emergency Medicine

## 2013-11-07 LAB — COMPREHENSIVE METABOLIC PANEL
Albumin: 3.6 g/dL (ref 3.4–5.0)
Alkaline Phosphatase: 67 U/L
BUN: 9 mg/dL (ref 7–18)
Bilirubin,Total: 0.4 mg/dL (ref 0.2–1.0)
Calcium, Total: 8.9 mg/dL (ref 8.5–10.1)
Co2: 24 mmol/L (ref 21–32)
Creatinine: 0.5 mg/dL — ABNORMAL LOW (ref 0.60–1.30)
EGFR (African American): >60
EGFR (Non-African Amer.): >60
Glucose: 87 mg/dL (ref 65–99)
Osmolality: 268 (ref 275–301)
SGOT(AST): 22 U/L (ref 15–37)
SGPT (ALT): 19 U/L (ref 12–78)
Total Protein: 7.9 g/dL (ref 6.4–8.2)

## 2013-11-07 LAB — URINALYSIS, COMPLETE
Bilirubin,UR: NEGATIVE
Glucose,UR: NEGATIVE mg/dL (ref 0–75)
Nitrite: NEGATIVE
Protein: NEGATIVE
RBC,UR: <1 /HPF (ref 0–5)
Squamous Epithelial: 10
WBC UR: 2 /HPF (ref 0–5)

## 2013-11-07 LAB — HCG, QUANTITATIVE, PREGNANCY: Beta Hcg, Quant.: 96316 m[IU]/mL — ABNORMAL HIGH

## 2013-11-07 LAB — LIPASE, BLOOD: Lipase: 203 U/L (ref 73–393)

## 2013-11-07 LAB — CBC
MCH: 32.1 pg (ref 26.0–34.0)
Platelet: 274 10*3/uL (ref 150–440)
WBC: 7.7 10*3/uL (ref 3.6–11.0)

## 2013-12-25 ENCOUNTER — Emergency Department: Payer: Self-pay | Admitting: Emergency Medicine

## 2013-12-25 LAB — CBC
HCT: 40 % (ref 35.0–47.0)
HGB: 13.5 g/dL (ref 12.0–16.0)
MCH: 31.6 pg (ref 26.0–34.0)
MCHC: 33.8 g/dL (ref 32.0–36.0)
MCV: 93 fL (ref 80–100)
Platelet: 224 10*3/uL (ref 150–440)
RBC: 4.28 10*6/uL (ref 3.80–5.20)
RDW: 12.7 % (ref 11.5–14.5)
WBC: 4.6 10*3/uL (ref 3.6–11.0)

## 2013-12-26 LAB — HCG, QUANTITATIVE, PREGNANCY: BETA HCG, QUANT.: 36 m[IU]/mL — AB

## 2013-12-27 ENCOUNTER — Emergency Department: Payer: Self-pay | Admitting: Emergency Medicine

## 2013-12-27 LAB — URINALYSIS, COMPLETE
Bilirubin,UR: NEGATIVE
GLUCOSE, UR: NEGATIVE mg/dL (ref 0–75)
Ketone: NEGATIVE
Leukocyte Esterase: NEGATIVE
Nitrite: NEGATIVE
Ph: 5 (ref 4.5–8.0)
Protein: NEGATIVE
SPECIFIC GRAVITY: 1.017 (ref 1.003–1.030)
Squamous Epithelial: 12

## 2013-12-27 LAB — CBC
HCT: 38.2 % (ref 35.0–47.0)
HGB: 13 g/dL (ref 12.0–16.0)
MCH: 31.5 pg (ref 26.0–34.0)
MCHC: 34 g/dL (ref 32.0–36.0)
MCV: 93 fL (ref 80–100)
Platelet: 203 10*3/uL (ref 150–440)
RBC: 4.12 10*6/uL (ref 3.80–5.20)
RDW: 12.3 % (ref 11.5–14.5)
WBC: 3.1 10*3/uL — ABNORMAL LOW (ref 3.6–11.0)

## 2013-12-27 LAB — HCG, QUANTITATIVE, PREGNANCY: Beta Hcg, Quant.: 32 m[IU]/mL — ABNORMAL HIGH

## 2014-01-23 ENCOUNTER — Emergency Department: Payer: Self-pay | Admitting: Emergency Medicine

## 2014-01-23 LAB — URINALYSIS, COMPLETE
Bacteria: NONE SEEN
Bilirubin,UR: NEGATIVE
GLUCOSE, UR: NEGATIVE mg/dL (ref 0–75)
Ketone: NEGATIVE
LEUKOCYTE ESTERASE: NEGATIVE
NITRITE: NEGATIVE
PH: 6 (ref 4.5–8.0)
Protein: NEGATIVE
RBC,UR: 1 /HPF (ref 0–5)
Specific Gravity: 1.019 (ref 1.003–1.030)
Squamous Epithelial: 5

## 2014-01-23 LAB — PREGNANCY, URINE: Pregnancy Test, Urine: NEGATIVE m[IU]/mL

## 2014-01-23 LAB — BASIC METABOLIC PANEL
ANION GAP: 4 — AB (ref 7–16)
BUN: 14 mg/dL (ref 7–18)
Calcium, Total: 9.1 mg/dL (ref 8.5–10.1)
Chloride: 103 mmol/L (ref 98–107)
Co2: 28 mmol/L (ref 21–32)
Creatinine: 0.85 mg/dL (ref 0.60–1.30)
EGFR (Non-African Amer.): >60
Glucose: 93 mg/dL (ref 65–99)
Osmolality: 270 (ref 275–301)
POTASSIUM: 3.7 mmol/L (ref 3.5–5.1)
Sodium: 135 mmol/L — ABNORMAL LOW (ref 136–145)

## 2014-01-23 LAB — CBC
HCT: 40.8 % (ref 35.0–47.0)
HGB: 13.7 g/dL (ref 12.0–16.0)
MCH: 31 pg (ref 26.0–34.0)
MCHC: 33.6 g/dL (ref 32.0–36.0)
MCV: 92 fL (ref 80–100)
Platelet: 246 10*3/uL (ref 150–440)
RBC: 4.43 10*6/uL (ref 3.80–5.20)
RDW: 12.4 % (ref 11.5–14.5)
WBC: 5 10*3/uL (ref 3.6–11.0)

## 2014-01-23 LAB — HCG, QUANTITATIVE, PREGNANCY

## 2014-01-24 LAB — GC/CHLAMYDIA PROBE AMP

## 2014-01-24 LAB — WET PREP, GENITAL

## 2014-03-27 ENCOUNTER — Inpatient Hospital Stay (HOSPITAL_COMMUNITY)
Admission: AD | Admit: 2014-03-27 | Discharge: 2014-03-27 | Disposition: A | Discharge status: Home / Self Care | Payer: Medicaid Other | Source: Ambulatory Visit | Attending: Obstetrics & Gynecology | Admitting: Obstetrics & Gynecology

## 2014-03-27 ENCOUNTER — Encounter (HOSPITAL_COMMUNITY): Payer: Self-pay

## 2014-03-27 DIAGNOSIS — R109 Unspecified abdominal pain: Secondary | ICD-10-CM | POA: Insufficient documentation

## 2014-03-27 DIAGNOSIS — N898 Other specified noninflammatory disorders of vagina: Secondary | ICD-10-CM | POA: Insufficient documentation

## 2014-03-27 DIAGNOSIS — B373 Candidiasis of vulva and vagina: Secondary | ICD-10-CM | POA: Insufficient documentation

## 2014-03-27 DIAGNOSIS — B3731 Acute candidiasis of vulva and vagina: Secondary | ICD-10-CM | POA: Insufficient documentation

## 2014-03-27 HISTORY — DX: Other specified health status: Z78.9

## 2014-03-27 LAB — URINALYSIS, ROUTINE W REFLEX MICROSCOPIC
Bilirubin Urine: NEGATIVE
Glucose, UA: NEGATIVE mg/dL
Hgb urine dipstick: NEGATIVE
Ketones, ur: NEGATIVE mg/dL
LEUKOCYTES UA: NEGATIVE
NITRITE: NEGATIVE
Protein, ur: NEGATIVE mg/dL
Specific Gravity, Urine: 1.02 (ref 1.005–1.030)
UROBILINOGEN UA: 0.2 mg/dL (ref 0.0–1.0)
pH: 5.5 (ref 5.0–8.0)

## 2014-03-27 LAB — WET PREP, GENITAL
Clue Cells Wet Prep HPF POC: NONE SEEN
TRICH WET PREP: NONE SEEN
YEAST WET PREP: NONE SEEN

## 2014-03-27 LAB — POCT PREGNANCY, URINE: PREG TEST UR: NEGATIVE

## 2014-03-27 MED ORDER — FLUCONAZOLE 150 MG PO TABS: 150.0000 mg | ORAL_TABLET | Freq: Every day | ORAL | Status: DC

## 2014-03-27 NOTE — Discharge Instructions (Signed)
Candidal Vulvovaginitis  Candidal vulvovaginitis is an infection of the vagina and vulva. The vulva is the skin around the opening of the vagina. This may cause itching and discomfort in and around the vagina.   HOME CARE  · Only take medicine as told by your doctor.  · Do not have sex (intercourse) until the infection is healed or as told by your doctor.  · Practice safe sex.  · Tell your sex partner about your infection.  · Do not douche or use tampons.  · Wear cotton underwear. Do not wear tight pants or panty hose.  · Eat yogurt. This may help treat and prevent yeast infections.  GET HELP RIGHT AWAY IF:   · You have a fever.  · Your problems get worse during treatment or do not get better in 3 days.  · You have discomfort, irritation, or itching in your vagina or vulva area.  · You have pain after sex.  · You start to get belly (abdominal) pain.  MAKE SURE YOU:  · Understand these instructions.  · Will watch your condition.  · Will get help right away if you are not doing well or get worse.  Document Released: 02/18/2009 Document Revised: 02/14/2012 Document Reviewed: 02/18/2009  [employer]® Patient Information ©2014 [employer].

## 2014-03-27 NOTE — MAU Note (Signed)
Patient states she had a terminated pregnancy in January. States she has been having abdominal pain since the procedure. States she has a white milky discharge. Denies bleeding, nausea or vomiting.

## 2014-03-27 NOTE — MAU Note (Signed)
Had an abortion on 12-16-13 and she has been cramping ever since; no bleeding today; having regular but heavy period since the abortion also;

## 2014-03-27 NOTE — MAU Provider Note (Signed)
History     CSN: [id number]  Arrival date and time: 03/27/14 1135   First Provider Initiated Contact with Patient 03/27/14 1303      Chief Complaint  Patient presents with  . Abdominal Pain   HPI [name] is a 28 y.o. [id number] who presents to MAU today with complaint of cramping and vaginal discharge. The patient had TAB in [place] on 12/06/13 at ~ [redacted] weeks GA. She states that they told her she could follow-up where ever she wanted and she states that she went to [facility] in [place]. She has had regular but heavy periods since TAB. LMP was 03/07/14. She has noted cramping off and on since the procedure. Pain now is rated at 3/10. She states that pain is often worse at night. She is having a thick, white discharge x 1-2 weeks without odor, irritation or itching. She denies fever, UTI symptoms today. She is having occasional nausea without vomiting, diarrhea or constipation.    OB History   Grav Para Term Preterm Abortions TAB SAB Ect Mult Living   5 1 1  0 4 1 3  0 0 1      Past Medical History  Diagnosis Date  . No pertinent past medical history   . Medical history non-contributory     Past Surgical History  Procedure Laterality Date  . Dilation and curettage of uterus    . Induced abortion      Family History  Problem Relation Age of Onset  . Parkinsonism Other   . Diabetes Other   . Schizophrenia Other   . Anesthesia problems Neg Hx     History  Substance Use Topics  . Smoking status: Never Smoker   . Smokeless tobacco: Not on file  . Alcohol Use: Yes     Comment: on weekends/ stopped with knowledge of pregnancy     Allergies:  Allergies  Allergen Reactions  . Penicillins Hives    8 months ago    Prescriptions prior to admission  Medication Sig Dispense Refill  . BIOTIN PO Take 2 capsules by mouth 2 (two) times daily.        Review of Systems  Constitutional: Negative for fever and malaise/fatigue.  Gastrointestinal: Positive for nausea and  abdominal pain. Negative for vomiting, diarrhea and constipation.  Genitourinary: Negative for dysuria, urgency and frequency.       Neg - vaginal bleeding + vaginal discharge   Physical Exam   Blood pressure 109/67, pulse 82, temperature 99.2 F (37.3 C), temperature source Oral, resp. rate 16, height 5\' 7"  (1.702 m), weight 154 lb 12.8 oz (70.217 kg), last menstrual period 03/07/2014, SpO2 99.00%, unknown if currently breastfeeding.  Physical Exam  Constitutional: She is oriented to person, place, and time. She appears well-developed and well-nourished. No distress.  HENT:  Head: Normocephalic and atraumatic.  Cardiovascular: Normal rate.   Respiratory: Effort normal.  GI: Soft. Bowel sounds are normal. She exhibits no distension and no mass. There is no tenderness. There is no rebound and no guarding.  Genitourinary: Uterus is not enlarged and not tender. Cervix exhibits no motion tenderness, no discharge and no friability. Right adnexum displays no mass and no tenderness. Left adnexum displays no mass and no tenderness. No bleeding around the vagina. Vaginal discharge (moderate amount of thick, white discharge noted) found.  Neurological: She is alert and oriented to person, place, and time.  Skin: Skin is warm and dry. No erythema.  Psychiatric: She has a normal mood  and affect.   Results for orders placed during the hospital encounter of 03/27/14 (from the past 24 hour(s))  URINALYSIS, ROUTINE W REFLEX MICROSCOPIC     Status: None   Collection Time    03/27/14 12:00 PM      Result Value Ref Range   Color, Urine YELLOW  YELLOW   APPearance CLEAR  CLEAR   Specific Gravity, Urine 1.020  1.005 - 1.030   pH 5.5  5.0 - 8.0   Glucose, UA NEGATIVE  NEGATIVE mg/dL   Hgb urine dipstick NEGATIVE  NEGATIVE   Bilirubin Urine NEGATIVE  NEGATIVE   Ketones, ur NEGATIVE  NEGATIVE mg/dL   Protein, ur NEGATIVE  NEGATIVE mg/dL   Urobilinogen, UA 0.2  0.0 - 1.0 mg/dL   Nitrite NEGATIVE   NEGATIVE   Leukocytes, UA NEGATIVE  NEGATIVE  POCT PREGNANCY, URINE     Status: None   Collection Time    03/27/14 12:08 PM      Result Value Ref Range   Preg Test, Ur NEGATIVE  NEGATIVE  WET PREP, GENITAL     Status: Abnormal   Collection Time    03/27/14  1:10 PM      Result Value Ref Range   Yeast Wet Prep HPF POC NONE SEEN  NONE SEEN   Trich, Wet Prep NONE SEEN  NONE SEEN   Clue Cells Wet Prep HPF POC NONE SEEN  NONE SEEN   WBC, Wet Prep HPF POC FEW (*) NONE SEEN     MAU Course  Procedures None  MDM UPT - negative UA, wet prep and GC/Chlamydia today Patient left prior to receiving final results. RN to call patient with discharge instructions.  Assessment and Plan  A: Yeast vulvovaginitis, clinical  P: Discharge home Rx for Diflucan sent to patient's pharmacy Recommended Ibuprofen PRN pain Patient given contact information for [facility] and [facility]. Advised to establish care with OB/Gyn if symptoms persist Patient may return to MAU as needed or if her condition were to change or worsen  Freddi StarrJulie N Ethier, PA-C  03/27/2014, 2:03 PM

## 2014-03-28 LAB — GC/CHLAMYDIA PROBE AMP
CT Probe RNA: NEGATIVE
GC Probe RNA: NEGATIVE

## 2014-04-17 ENCOUNTER — Emergency Department (HOSPITAL_COMMUNITY)
Admission: EM | Admit: 2014-04-17 | Discharge: 2014-04-17 | Disposition: A | Discharge status: Home / Self Care | Payer: Medicaid Other | Attending: Emergency Medicine | Admitting: Emergency Medicine

## 2014-04-17 ENCOUNTER — Encounter (HOSPITAL_COMMUNITY): Payer: Self-pay | Admitting: Emergency Medicine

## 2014-04-17 ENCOUNTER — Emergency Department (HOSPITAL_COMMUNITY): Payer: Medicaid Other

## 2014-04-17 DIAGNOSIS — Z79899 Other long term (current) drug therapy: Secondary | ICD-10-CM | POA: Insufficient documentation

## 2014-04-17 DIAGNOSIS — R1011 Right upper quadrant pain: Secondary | ICD-10-CM | POA: Insufficient documentation

## 2014-04-17 DIAGNOSIS — N898 Other specified noninflammatory disorders of vagina: Secondary | ICD-10-CM | POA: Insufficient documentation

## 2014-04-17 DIAGNOSIS — R110 Nausea: Secondary | ICD-10-CM

## 2014-04-17 DIAGNOSIS — Z880 Allergy status to penicillin: Secondary | ICD-10-CM | POA: Insufficient documentation

## 2014-04-17 DIAGNOSIS — R109 Unspecified abdominal pain: Secondary | ICD-10-CM

## 2014-04-17 LAB — URINALYSIS, ROUTINE W REFLEX MICROSCOPIC
Bilirubin Urine: NEGATIVE
Glucose, UA: NEGATIVE mg/dL
Hgb urine dipstick: NEGATIVE
KETONES UR: NEGATIVE mg/dL
NITRITE: NEGATIVE
PROTEIN: NEGATIVE mg/dL
Specific Gravity, Urine: 1.01 (ref 1.005–1.030)
Urobilinogen, UA: 0.2 mg/dL (ref 0.0–1.0)
pH: 6 (ref 5.0–8.0)

## 2014-04-17 LAB — COMPREHENSIVE METABOLIC PANEL
ALK PHOS: 56 U/L (ref 39–117)
ALT: 14 U/L (ref 0–35)
AST: 17 U/L (ref 0–37)
Albumin: 3.8 g/dL (ref 3.5–5.2)
BILIRUBIN TOTAL: 0.5 mg/dL (ref 0.3–1.2)
BUN: 11 mg/dL (ref 6–23)
CO2: 25 mEq/L (ref 19–32)
CREATININE: 0.84 mg/dL (ref 0.50–1.10)
Calcium: 9.1 mg/dL (ref 8.4–10.5)
Chloride: 103 mEq/L (ref 96–112)
GFR calc non Af Amer: >90 mL/min (ref >90)
GLUCOSE: 70 mg/dL (ref 70–99)
POTASSIUM: 4 meq/L (ref 3.7–5.3)
Sodium: 139 mEq/L (ref 137–147)
Total Protein: 7.6 g/dL (ref 6.0–8.3)

## 2014-04-17 LAB — WET PREP, GENITAL: Trich, Wet Prep: NONE SEEN

## 2014-04-17 LAB — CBC WITH DIFFERENTIAL/PLATELET
Basophils Absolute: 0 10*3/uL (ref 0.0–0.1)
Basophils Relative: 0 % (ref 0–1)
EOS PCT: 2 % (ref 0–5)
Eosinophils Absolute: 0.1 10*3/uL (ref 0.0–0.7)
HEMATOCRIT: 37.3 % (ref 36.0–46.0)
HEMOGLOBIN: 13.1 g/dL (ref 12.0–15.0)
Lymphocytes Relative: 49 % — ABNORMAL HIGH (ref 12–46)
Lymphs Abs: 2.2 10*3/uL (ref 0.7–4.0)
MCH: 31 pg (ref 26.0–34.0)
MCHC: 35.1 g/dL (ref 30.0–36.0)
MCV: 88.4 fL (ref 78.0–100.0)
MONO ABS: 0.4 10*3/uL (ref 0.1–1.0)
MONOS PCT: 9 % (ref 3–12)
Neutro Abs: 1.8 10*3/uL (ref 1.7–7.7)
Neutrophils Relative %: 40 % — ABNORMAL LOW (ref 43–77)
Platelets: 228 10*3/uL (ref 150–400)
RBC: 4.22 MIL/uL (ref 3.87–5.11)
RDW: 12.2 % (ref 11.5–15.5)
WBC: 4.4 10*3/uL (ref 4.0–10.5)

## 2014-04-17 LAB — URINE MICROSCOPIC-ADD ON

## 2014-04-17 LAB — LIPASE, BLOOD: LIPASE: 52 U/L (ref 11–59)

## 2014-04-17 MED ORDER — ONDANSETRON 4 MG PO TBDP: 4.0000 mg | ORAL_TABLET | Freq: Once | ORAL | Status: DC

## 2014-04-17 MED ORDER — ONDANSETRON HCL 4 MG/2ML IJ SOLN
4.0000 mg | Freq: Once | INTRAMUSCULAR | Status: AC
  Administered 2014-04-17: 4 mg via INTRAVENOUS
  Filled 2014-04-17: qty 2

## 2014-04-17 MED ORDER — PROMETHAZINE HCL 25 MG PO TABS: 25.0000 mg | ORAL_TABLET | Freq: Four times a day (QID) | ORAL | Status: DC | PRN

## 2014-04-17 NOTE — ED Notes (Signed)
Ultrasound at bedside

## 2014-04-17 NOTE — ED Notes (Signed)
Per pt, states abdominal pain and cramping since January when she had an abortion-has been evaluated since and everything was negative

## 2014-04-17 NOTE — ED Notes (Signed)
Bed: WA16 Expected date:  Expected time:  Means of arrival:  Comments: No monitor 

## 2014-04-17 NOTE — Discharge Instructions (Signed)
Do not hesitate to return to the Emergency Department for any new, worsening or concerning symptoms.   If you do not have a primary care doctor you can establish one at the   [facility]: [address] [place] [id number] [phone]  After you establish care. Let them know you were seen in the emergency room. They must obtain records for further management.   Abdominal Pain, Women Abdominal (stomach, pelvic, or belly) pain can be caused by many things. It is important to tell your doctor:  The location of the pain.  Does it come and go or is it present all the time?  Are there things that start the pain (eating certain foods, exercise)?  Are there other symptoms associated with the pain (fever, nausea, vomiting, diarrhea)? All of this is helpful to know when trying to find the cause of the pain. CAUSES   Stomach: virus or bacteria infection, or ulcer.  Intestine: appendicitis (inflamed appendix), regional ileitis (Crohn's disease), ulcerative colitis (inflamed colon), irritable bowel syndrome, diverticulitis (inflamed diverticulum of the colon), or cancer of the stomach or intestine.  Gallbladder disease or stones in the gallbladder.  Kidney disease, kidney stones, or infection.  Pancreas infection or cancer.  Fibromyalgia (pain disorder).  Diseases of the female organs:  Uterus: fibroid (non-cancerous) tumors or infection.  Fallopian tubes: infection or tubal pregnancy.  Ovary: cysts or tumors.  Pelvic adhesions (scar tissue).  Endometriosis (uterus lining tissue growing in the pelvis and on the pelvic organs).  Pelvic congestion syndrome (female organs filling up with blood just before the menstrual period).  Pain with the menstrual period.  Pain with ovulation (producing an egg).  Pain with an IUD (intrauterine device, birth control) in the uterus.  Cancer of the female organs.  Functional pain (pain not caused by a disease, may  improve without treatment).  Psychological pain.  Depression. DIAGNOSIS  Your doctor will decide the seriousness of your pain by doing an examination.  Blood tests.  X-rays.  Ultrasound.  CT scan (computed tomography, special type of X-ray).  MRI (magnetic resonance imaging).  Cultures, for infection.  Barium enema (dye inserted in the large intestine, to better view it with X-rays).  Colonoscopy (looking in intestine with a lighted tube).  Laparoscopy (minor surgery, looking in abdomen with a lighted tube).  Major abdominal exploratory surgery (looking in abdomen with a large incision). TREATMENT  The treatment will depend on the cause of the pain.   Many cases can be observed and treated at home.  Over-the-counter medicines recommended by your caregiver.  Prescription medicine.  Antibiotics, for infection.  Birth control pills, for painful periods or for ovulation pain.  Hormone treatment, for endometriosis.  Nerve blocking injections.  Physical therapy.  Antidepressants.  Counseling with a psychologist or psychiatrist.  Minor or major surgery. HOME CARE INSTRUCTIONS   Do not take laxatives, unless directed by your caregiver.  Take over-the-counter pain medicine only if ordered by your caregiver. Do not take aspirin because it can cause an upset stomach or bleeding.  Try a clear liquid diet (broth or water) as ordered by your caregiver. Slowly move to a bland diet, as tolerated, if the pain is related to the stomach or intestine.  Have a thermometer and take your temperature several times a day, and record it.  Bed rest and sleep, if it helps the pain.  Avoid sexual intercourse, if it causes pain.  Avoid stressful situations.  Keep your follow-up appointments and tests, as  your caregiver orders.  If the pain does not go away with medicine or surgery, you may try:  Acupuncture.  Relaxation exercises (yoga, meditation).  Group  therapy.  Counseling. SEEK MEDICAL CARE IF:   You notice certain foods cause stomach pain.  Your home care treatment is not helping your pain.  You need stronger pain medicine.  You want your IUD removed.  You feel faint or lightheaded.  You develop nausea and vomiting.  You develop a rash.  You are having side effects or an allergy to your medicine. SEEK IMMEDIATE MEDICAL CARE IF:   Your pain does not go away or gets worse.  You have a fever.  Your pain is felt only in portions of the abdomen. The right side could possibly be appendicitis. The left lower portion of the abdomen could be colitis or diverticulitis.  You are passing blood in your stools (bright red or black tarry stools, with or without vomiting).  You have blood in your urine.  You develop chills, with or without a fever.  You pass out. MAKE SURE YOU:   Understand these instructions.  Will watch your condition.  Will get help right away if you are not doing well or get worse. Document Released: 09/19/2007 Document Revised: 02/14/2012 Document Reviewed: 10/09/2009 [facility] Patient Information 2014 [place], [place].

## 2014-04-17 NOTE — ED Provider Notes (Signed)
CSN: [id number]     Arrival date & time 04/17/14  1552 History   First MD Initiated Contact with Patient 04/17/14 1635     Chief Complaint  Patient presents with  . Abdominal Pain     (Consider location/radiation/quality/duration/timing/severity/associated sxs/prior Treatment) HPI   Madison [name] is a 28 y.o. female complaining of intermittent abdominal cramping rated at 5/10 at worst onset approximately 5 months ago after patient had induced abortion. Patient had OB/GYN followup with no issues. States that the discomfort is exacerbated by eating. She has had nausea with no vomiting it is associated with the pain. Patient states she presents to the ED today because the nausea is getting worse. Patient denies abnormal vaginal discharge, fever, vomiting, rashes lesions, dysuria, hematuria.   Past Medical History  Diagnosis Date  . No pertinent past medical history   . Medical history non-contributory    Past Surgical History  Procedure Laterality Date  . Dilation and curettage of uterus    . Induced abortion     Family History  Problem Relation Age of Onset  . Parkinsonism Other   . Diabetes Other   . Schizophrenia Other   . Anesthesia problems Neg Hx    History  Substance Use Topics  . Smoking status: Never Smoker   . Smokeless tobacco: Not on file  . Alcohol Use: Yes     Comment: on weekends/ stopped with knowledge of pregnancy    OB History   Grav Para Term Preterm Abortions TAB SAB Ect Mult Living   5 1 1  0 4 1 3  0 0 1     Review of Systems  10 systems reviewed and found to be negative, except as noted in the HPI.  Allergies  Penicillins  Home Medications   Prior to Admission medications   Medication Sig Start Date End Date Taking? Authorizing Provider  BIOTIN PO Take 2 capsules by mouth 2 (two) times daily.    Historical Provider, MD  fluconazole (DIFLUCAN) 150 MG tablet Take 1 tablet (150 mg total) by mouth daily. 03/27/14   Freddi StarrJulie N Ethier, PA-C   BP  105/58  Pulse 73  Temp(Src) 98.7 F (37.1 C) (Oral)  Resp 16  SpO2 98%  LMP 03/20/2014 Physical Exam  Nursing note and vitals reviewed. Constitutional: She is oriented to person, place, and time. She appears well-developed and well-nourished. No distress.  HENT:  Head: Normocephalic and atraumatic.  Mouth/Throat: Oropharynx is clear and moist.  Eyes: Conjunctivae and EOM are normal. Pupils are equal, round, and reactive to light.  Neck: Normal range of motion. Neck supple.  Cardiovascular: Normal rate, regular rhythm and intact distal pulses.   Pulmonary/Chest: Effort normal and breath sounds normal. No stridor. No respiratory distress. She has no wheezes. She has no rales. She exhibits no tenderness.  Abdominal: Soft. Bowel sounds are normal. She exhibits no distension and no mass. There is tenderness. There is no rebound and no guarding.  Mild tender to palpation of right upper quadrant with no guarding or rebound.  Genitourinary:  Pelvic exam chaperoned by technician: There is no rashes and lesions, there is a thick, white, non-foul-smelling, homogenous discharge.  There are no adnexal or cervical motion tenderness.  Musculoskeletal: Normal range of motion.  Neurological: She is alert and oriented to person, place, and time.  Psychiatric: She has a normal mood and affect.    ED Course  Procedures (including critical care time) Labs Review Labs Reviewed  WET PREP, GENITAL - Abnormal; Notable  for the following:    Yeast Wet Prep HPF POC FEW (*)    Clue Cells Wet Prep HPF POC FEW (*)    WBC, Wet Prep HPF POC FEW (*)    All other components within normal limits  CBC WITH DIFFERENTIAL - Abnormal; Notable for the following:    Neutrophils Relative % 40 (*)    Lymphocytes Relative 49 (*)    All other components within normal limits  URINALYSIS, ROUTINE W REFLEX MICROSCOPIC - Abnormal; Notable for the following:    APPearance CLOUDY (*)    Leukocytes, UA TRACE (*)    All other  components within normal limits  URINE MICROSCOPIC-ADD ON - Abnormal; Notable for the following:    Squamous Epithelial / LPF FEW (*)    Bacteria, UA FEW (*)    All other components within normal limits  GC/CHLAMYDIA PROBE AMP  COMPREHENSIVE METABOLIC PANEL  LIPASE, BLOOD  POC URINE PREG, ED    Imaging Review US Abdomen Complete  04/17/2014   CLINICAL DATA:  Right upper quadrant pain and nausea  EXAM: ULTRASOUND ABDOMEN COMPLETE  COMPARISON:  None.  FINDINGS: Gallbladder:  No gallstones or wall thickening visualized. No sonographic Murphy sign noted.  Common bile duct:  Diameter: 2 mm.  Liver:  No focal lesion identified. Within normal limits in parenchymal echogenicity.  IVC:  No abnormality visualized.  Pancreas:  Visualized portion unremarkable.  Spleen:  Size and appearance within normal limits.  Right Kidney:  Length: 12.2 cm. Echogenicity within normal limits. No mass or hydronephrosis visualized.  Left Kidney:  Length: 11.1 cm. Echogenicity within normal limits. No mass or hydronephrosis visualized.  Abdominal aorta:  No aneurysm visualized.  Other findings:  None.  IMPRESSION: No acute abnormality noted.   Electronically Signed   By: Alcide Clever M.D.   On: 04/17/2014 18:16     EKG Interpretation None      MDM   Final diagnoses:  Nausea  Abdominal cramping    Filed Vitals:   04/17/14 1617 04/17/14 1825  BP: 105/59 105/58  Pulse: 76 73  Temp: 99 F (37.2 C) 98.7 F (37.1 C)  TempSrc: Oral Oral  Resp: 18 16  SpO2: 100% 98%    Medications  ondansetron (ZOFRAN) injection 4 mg (4 mg Intravenous Given 04/17/14 1641)    Madison [name] SHERETA CROTHERS is a 28 y.o. female presenting with cramping for several months status post elective abortion. Patient also has increasing nausea. On physical exam there is tenderness in the right upper quadrant. Serial abdominal exams remained benign. Pelvic exam without acute abnormality. Wet prep i shows a few yeast, clue and white blood cells. I doubt  this is the cause of her discomfort. Blood work and urinalysis are reassuring. Abdominal ultrasound with no abnormality. Patient is tolerating by mouth's.  Evaluation does not show pathology that would require ongoing emergent intervention or inpatient treatment. Pt is hemodynamically stable and mentating appropriately. Discussed findings and plan with patient/guardian, who agrees with care plan. All questions answered. Return precautions discussed and outpatient follow up given.   Discharge Medication List as of 04/17/2014  6:38 PM    START taking these medications   Details  promethazine (PHENERGAN) 25 MG tablet Take 1 tablet (25 mg total) by mouth every 6 (six) hours as needed for nausea or vomiting., Starting 04/17/2014, Until Discontinued, Print        Note: Portions of this report may have been transcribed using voice recognition software. Every effort was made to ensure  accuracy; however, inadvertent computerized transcription errors may be present     Wynetta Emeryicole Lianni Kanaan, PA-C 04/18/14 0145

## 2014-04-17 NOTE — ED Notes (Signed)
Pt alert and oriented x4. Respirations even and unlabored, bilateral symmetrical rise and fall of chest. Skin warm and dry. In no acute distress. Denies needs.   

## 2014-04-18 LAB — GC/CHLAMYDIA PROBE AMP
CT Probe RNA: NEGATIVE
GC Probe RNA: NEGATIVE

## 2014-04-20 NOTE — ED Provider Notes (Signed)
Medical screening examination/treatment/procedure(s) were performed by non-physician practitioner and as supervising physician I was immediately available for consultation/collaboration.    Davionte Lusby R Dayon Witt, MD 04/20/14 0711 

## 2014-10-07 ENCOUNTER — Encounter (HOSPITAL_COMMUNITY): Payer: Self-pay | Admitting: Emergency Medicine

## 2015-02-27 ENCOUNTER — Emergency Department: Payer: Self-pay | Admitting: Emergency Medicine

## 2015-02-27 LAB — CBC WITH DIFFERENTIAL/PLATELET
BASOS ABS: 0 10*3/uL (ref 0.0–0.1)
BASOS PCT: 0.3 %
EOS ABS: 0.1 10*3/uL (ref 0.0–0.7)
Eosinophil %: 1.2 %
HCT: 41.8 % (ref 35.0–47.0)
HGB: 14 g/dL (ref 12.0–16.0)
LYMPHS ABS: 1.5 10*3/uL (ref 1.0–3.6)
Lymphocyte %: 28.4 %
MCH: 30.8 pg (ref 26.0–34.0)
MCHC: 33.5 g/dL (ref 32.0–36.0)
MCV: 92 fL (ref 80–100)
MONO ABS: 0.4 x10 3/mm (ref 0.2–0.9)
Monocyte %: 7.3 %
NEUTROS ABS: 3.4 10*3/uL (ref 1.4–6.5)
Neutrophil %: 62.8 %
Platelet: 263 10*3/uL (ref 150–440)
RBC: 4.54 10*6/uL (ref 3.80–5.20)
RDW: 12.5 % (ref 11.5–14.5)
WBC: 5.4 10*3/uL (ref 3.6–11.0)

## 2015-02-27 LAB — URINALYSIS, COMPLETE
Bacteria: NONE SEEN
Bilirubin,UR: NEGATIVE
Glucose,UR: NEGATIVE mg/dL (ref 0–75)
KETONE: NEGATIVE
Nitrite: NEGATIVE
PH: 7 (ref 4.5–8.0)
Protein: 100
Specific Gravity: 1.012 (ref 1.003–1.030)
Squamous Epithelial: 1

## 2015-02-27 LAB — COMPREHENSIVE METABOLIC PANEL
ALT: 16 U/L
AST: 19 U/L
Albumin: 4.7 g/dL
Alkaline Phosphatase: 78 U/L
Anion Gap: 7 (ref 7–16)
BUN: 10 mg/dL
Bilirubin,Total: 0.9 mg/dL
CHLORIDE: 102 mmol/L
Calcium, Total: 9.2 mg/dL
Co2: 28 mmol/L
Creatinine: 0.76 mg/dL
Glucose: 93 mg/dL
POTASSIUM: 4 mmol/L
SODIUM: 137 mmol/L
Total Protein: 8.3 g/dL — ABNORMAL HIGH

## 2015-02-27 LAB — GC/CHLAMYDIA PROBE AMP

## 2015-02-27 LAB — WET PREP, GENITAL

## 2015-03-29 NOTE — Consult Note (Signed)
Brief Consult Note: Diagnosis: Incomplete abortion vs endometritis.   Patient was seen by consultant.   Consult note dictated.   Recommend further assessment or treatment.   Discussed with Attending MD.   Comments: US personally reviewed some thickending of the stripe, minimal bleeding, abdominal pain is epigastric.   - methergine 0.2mg  tab po tid x 3 days - ibuprofen 600mg  po q8hrs prn pain - percocet 5/325 1 tab po q4hrs prn pain #20 tab - doxycyline 100mg  po bid x 10 days - flayl 500mg  po bid x 10 days - follow up with myself 01/01/2014 [place] office at 0800.  Electronic Signatures: Madison [name], Madison [name] (MD)  (Signed 22-Jan-15 16:09)  Authored: Brief Consult Note   Last Updated: 22-Jan-15 16:09 by Madison [name], Kuuipo Anzaldo [name] (MD)

## 2015-04-15 NOTE — H&P (Signed)
L&D Evaluation:  History:  HPI 29 yo G5P1041 presenting to ER with epigastric/supraumbilical pain and continued vaginal bleeding after undergoing spontaneous abortion of an 11 week previously documented IUP.  She reports an LMP around September 16th with paassage of the pregnancy around 4 weeks ago.  Bleeding is reported as light, she has not pelvic pain or menstrual type cramping.  Denies fevers or chills.  Has not passed any clots.   Presents with abdominal pain   Patient's Medical History No Chronic Illness  G1 pregnancy molar pregnancy   Patient's Surgical History D&C  C-section   Medications none   Allergies PCN   Social History none   Family History Non-Contributory   ROS:  ROS All systems were reviewed.  HEENT, CNS, GI, GU, Respiratory, CV, Renal and Musculoskeletal systems were found to be normal.   Exam:  Vital Signs stable   General no apparent distress   Mental Status clear   Chest clear   Heart normal sinus rhythm   Abdomen NABS, soft, non-tender to palpation, no rebound, no guarding   Back no CVAT   Edema no edema   Pelvic no external lesions, cervix closed and thick, normal size anteverted uterus, cervix closed, no CMT, no adnexal masses or tenderness, minimal bleeding with some old blood in vault (less than 1 scopet   Other TVUS - personally reviewed, the endometrial measurement appears to also incorporate some myometrium into the actuall measurement.  THe stripe is heterogenous an indistinct.  WBC non-elevated at 3.1K, H&H 13.0 & 38.2, platelets 203K  HCG of 32   Impression:  Impression 29 yo [id number] with incomplete abortion vs endometritis, vs normal postabortion bleeding   Plan:  Comments 1) Abdominal pain/SAB - does not appear to be uterine or gyn in origin given location in mid epigastric region.  Denies taking a lot of NSDAIDs.       - will obtain repeat TVUS 1/27 in [place] with myself 0800      - Methergine 0.2mg  tab po tid x 3 days  - Ibuprofen 600mg  tab po tid prn pain      - percocet 5/325 1 tab po every 4-hrs prn pain       - will presumptively cover for possible endometritis with flagyl and doxycyline 10 day course       - if fails to improve on this regimen or increased bleeding will post for scheduled D&C   Follow Up Appointment already scheduled   Electronic Signatures: Lorrene ReidStaebler, Danyael Alipio M (MD)  (Signed 22-Jan-15 17:17)  Authored: L&D Evaluation   Last Updated: 22-Jan-15 17:17 by Lorrene ReidStaebler, Sakai Heinle M (MD)

## 2019-06-21 ENCOUNTER — Ambulatory Visit (HOSPITAL_COMMUNITY)
Admission: EM | Admit: 2019-06-21 | Discharge: 2019-06-21 | Disposition: A | Discharge status: Home / Self Care | Payer: Self-pay | Attending: Internal Medicine | Admitting: Internal Medicine

## 2019-06-21 ENCOUNTER — Other Ambulatory Visit: Payer: Self-pay

## 2019-06-21 ENCOUNTER — Encounter (HOSPITAL_COMMUNITY): Payer: Self-pay

## 2019-06-21 DIAGNOSIS — K2970 Gastritis, unspecified, without bleeding: Secondary | ICD-10-CM | POA: Insufficient documentation

## 2019-06-21 DIAGNOSIS — K2990 Gastroduodenitis, unspecified, without bleeding: Secondary | ICD-10-CM | POA: Insufficient documentation

## 2019-06-21 DIAGNOSIS — N390 Urinary tract infection, site not specified: Secondary | ICD-10-CM | POA: Insufficient documentation

## 2019-06-21 LAB — POCT URINALYSIS DIP (DEVICE)
Bilirubin Urine: NEGATIVE
Glucose, UA: NEGATIVE mg/dL
Ketones, ur: NEGATIVE mg/dL
Nitrite: NEGATIVE
Protein, ur: 30 mg/dL — AB
Specific Gravity, Urine: 1.02 (ref 1.005–1.030)
Urobilinogen, UA: 1 mg/dL (ref 0.0–1.0)
pH: 5.5 (ref 5.0–8.0)

## 2019-06-21 MED ORDER — PANTOPRAZOLE SODIUM 20 MG PO TBEC: 20.0000 mg | DELAYED_RELEASE_TABLET | Freq: Every day | ORAL | 0 refills | Status: DC

## 2019-06-21 MED ORDER — NITROFURANTOIN MONOHYD MACRO 100 MG PO CAPS: 100.0000 mg | ORAL_CAPSULE | Freq: Two times a day (BID) | ORAL | 0 refills | Status: DC

## 2019-06-21 NOTE — ED Triage Notes (Signed)
Pt presents with epigastric abdominal pain that she says is mostly in the morning upon waking up; pt also complains of urinary tract symptoms, discolored urine and odor to urine.

## 2019-06-21 NOTE — ED Provider Notes (Signed)
[place]    CSN: [id number] Arrival date & time: 06/21/19  1800     History   Chief Complaint Chief Complaint  Patient presents with  . Abdominal Pain  . Urinary Tract Infection    HPI Madison [name] is a 33 y.o. female no past medical history comes to urgent care with complaints of epigastric abdominal pain as well as lower abdominal pain and dysuria.  Epigastric abdominal pain started this morning.  It usually worse when she wakes up in the morning.  Is aggravated by oral intake.  No known relieving factors.  No radiation of the pain.  Pain is throbbing and constant and is of variable severity.  Patient denies any nausea or vomiting.  No change in bowel movement. Patient also complains of dysuria, urgency and some frequency as well as worsening urine odor.  No vaginal discharge.  No dyspareunia.  Past Medical History:  Diagnosis Date  . Medical history non-contributory   . No pertinent past medical history     There are no active problems to display for this patient.   Past Surgical History:  Procedure Laterality Date  . DILATION AND CURETTAGE OF UTERUS    . INDUCED ABORTION      OB History    Gravida  5   Para  1   Term  1   Preterm  0   AB  4   Living  1     SAB  3   TAB  1   Ectopic  0   Multiple  0   Live Births  1            Home Medications    Prior to Admission medications   Medication Sig Start Date End Date Taking? Authorizing Provider  nitrofurantoin, macrocrystal-monohydrate, (MACROBID) 100 MG capsule Take 1 capsule (100 mg total) by mouth 2 (two) times daily. 06/21/19   Chase Picket, MD  pantoprazole (PROTONIX) 20 MG tablet Take 1 tablet (20 mg total) by mouth daily. 06/21/19   LampteyMyrene Galas, MD  promethazine (PHENERGAN) 25 MG tablet Take 1 tablet (25 mg total) by mouth every 6 (six) hours as needed for nausea or vomiting. 04/17/14   Pisciotta, Charna Elizabeth    Family History Family History  Problem  Relation Age of Onset  . Parkinsonism Other   . Diabetes Other   . Schizophrenia Other   . Anesthesia problems Neg Hx     Social History Social History   Tobacco Use  . Smoking status: Never Smoker  Substance Use Topics  . Alcohol use: Yes    Comment: on weekends/ stopped with knowledge of pregnancy   . Drug use: No     Allergies   Penicillins   Review of Systems Review of Systems  Constitutional: Negative for activity change, appetite change, fatigue and fever.  Respiratory: Negative.   Cardiovascular: Negative.   Gastrointestinal: Positive for abdominal pain. Negative for diarrhea, nausea and vomiting.  Genitourinary: Positive for dysuria, flank pain, frequency, pelvic pain and urgency. Negative for dyspareunia and vaginal discharge.  Neurological: Negative for dizziness, weakness, light-headedness and headaches.  All other systems reviewed and are negative.    Physical Exam Triage Vital Signs ED Triage Vitals [06/21/19 1831]  Enc Vitals Group     BP 97/65     Pulse Rate 97     Resp 18     Temp 99 F (37.2 C)     Temp Source Oral  SpO2 95 %     Weight      Height      Head Circumference      Peak Flow      Pain Score      Pain Loc      Pain Edu?      Excl. in GC?    No data found.  Updated Vital Signs BP 97/65 (BP Location: Right Arm)   Pulse 97   Temp 99 F (37.2 C) (Oral)   Resp 18   LMP 06/10/2019   SpO2 95%   Visual Acuity Right Eye Distance:   Left Eye Distance:   Bilateral Distance:    Right Eye Near:   Left Eye Near:    Bilateral Near:     Physical Exam Constitutional:      General: She is not in acute distress.    Appearance: She is well-developed. She is not ill-appearing or toxic-appearing.  Cardiovascular:     Heart sounds: Normal heart sounds. No murmur. No gallop.   Pulmonary:     Effort: Pulmonary effort is normal. No respiratory distress.     Breath sounds: Normal breath sounds. No wheezing or rales.  Abdominal:      General: Bowel sounds are normal.     Tenderness: There is abdominal tenderness in the epigastric area. There is no right CVA tenderness, guarding or rebound. Negative signs include Murphy's sign and McBurney's sign.  Skin:    Capillary Refill: Capillary refill takes less than 2 seconds.  Neurological:     General: No focal deficit present.     Mental Status: She is alert and oriented to person, place, and time.      UC Treatments / Results  Labs (all labs ordered are listed, but only abnormal results are displayed) Labs Reviewed  POCT URINALYSIS DIP (DEVICE) - Abnormal; Notable for the following components:      Result Value   Hgb urine dipstick TRACE (*)    Protein, ur 30 (*)    Leukocytes,Ua SMALL (*)    All other components within normal limits  URINE CULTURE    EKG   Radiology No results found.  Procedures Procedures (including critical care time)  Medications Ordered in UC Medications - No data to display  Initial Impression / Assessment and Plan / UC Course  I have reviewed the triage vital signs and the nursing notes.  Pertinent labs & imaging results that were available during my care of the patient were reviewed by me and considered in my medical decision making (see chart for details).     1.  Peptic ulcer disease: Protonix 20 mg orally daily Patient is advised to be compliant with medication If patient symptoms does not improve after a month, GI evaluation will be helpful.  2.  Lower urinary tract infection: Point-of-care urinalysis is positive for leukocyte estrace and hemoglobin. Urine culture sent Nitrofurantoin 100 mg orally twice daily for 7 days If patient's symptoms worsens she is advised to return to urgent care to be reevaluated. Final Clinical Impressions(s) / UC Diagnoses   Final diagnoses:  Lower urinary tract infectious disease  Gastritis and gastroduodenitis   Discharge Instructions   None    ED Prescriptions    Medication  Sig Dispense Auth. Provider   pantoprazole (PROTONIX) 20 MG tablet Take 1 tablet (20 mg total) by mouth daily. 30 tablet Lamptey, Britta MccreedyPhilip O, MD   nitrofurantoin, macrocrystal-monohydrate, (MACROBID) 100 MG capsule Take 1 capsule (100 mg total) by mouth  2 (two) times daily. 10 capsule Lamptey, Britta MccreedyPhilip O, MD     Controlled Substance Prescriptions [place] Controlled Substance Registry consulted? No   Merrilee JanskyLamptey, Philip O, MD 06/21/19 2042

## 2019-06-23 LAB — URINE CULTURE: Culture: 30000 — AB

## 2019-06-27 ENCOUNTER — Telehealth: Payer: Self-pay | Admitting: Emergency Medicine

## 2019-06-27 NOTE — Telephone Encounter (Signed)
Urine culture was positive for e coli and was given  macrobid at urgent care visit. Attempted to reach patient. No answer at this time.   

## 2020-01-25 ENCOUNTER — Ambulatory Visit (HOSPITAL_COMMUNITY)
Admission: EM | Admit: 2020-01-25 | Discharge: 2020-01-25 | Disposition: A | Discharge status: Home / Self Care | Payer: Self-pay | Attending: Family Medicine | Admitting: Family Medicine

## 2020-01-25 ENCOUNTER — Other Ambulatory Visit: Payer: Self-pay

## 2020-01-25 ENCOUNTER — Ambulatory Visit (INDEPENDENT_AMBULATORY_CARE_PROVIDER_SITE_OTHER): Payer: Self-pay

## 2020-01-25 ENCOUNTER — Encounter (HOSPITAL_COMMUNITY): Payer: Self-pay

## 2020-01-25 DIAGNOSIS — M25562 Pain in left knee: Secondary | ICD-10-CM

## 2020-01-25 DIAGNOSIS — N644 Mastodynia: Secondary | ICD-10-CM

## 2020-01-25 DIAGNOSIS — G8929 Other chronic pain: Secondary | ICD-10-CM

## 2020-01-25 MED ORDER — NAPROXEN 500 MG PO TABS: 500.0000 mg | ORAL_TABLET | Freq: Two times a day (BID) | ORAL | 0 refills | Status: AC

## 2020-01-25 NOTE — ED Triage Notes (Addendum)
Pt reports having left knee pain x 3 years, the past 3 weeks are the worst; left hip pain x 2 months. Pt reports the pain is worse when she walks. Pt reports having left breast pain x 5 months.  Tylenol give some relief to the knee and hip pain.

## 2020-01-25 NOTE — Discharge Instructions (Addendum)
Please use Naprosyn twice daily for breast pain and knee pain Apply warm compresses to breast Follow-up with breast center-they should call you with appointment time  Ice and elevate knee Rest if possible May use Ace wrap for comfort Follow-up with sports medicine if knee pain persisting

## 2020-01-26 NOTE — ED Provider Notes (Signed)
[facility]    CSN: [id number] Arrival date & time: 01/25/20  1725      History   Chief Complaint Chief Complaint  Patient presents with  . Knee Pain  . Hip Pain  . Breast Pain    HPI Madison [name] is a 34 y.o. female no significant past medical history presenting today for evaluation of left knee pain and left breast pain.  Patient states that she has had left knee pain for approximately 2 years.  She recalls a remote injury where she twisted her knee and since she has had persistent pain.  Over the past month symptoms have been worse.  Occasionally pain will radiate into left hip.  Worse with weightbearing.  Denies any new injury or fall.  Does report an occasional popping sensation, but denies any tearing or instability.  She has used Tylenol and Excedrin with mild relief.  Patient also reports left breast pain.  Describes as a mild dull aching sensation.  Pain has been present for approximately 5 months.  She reports that this has been constant over the 5 months and denies symptoms being relieved when she is not on her menstrual cycle.  Has not felt any abnormalities.  Denies breast-feeding.  Denies any changes to the skin.  Denies fevers.  Denies family history of breast cancer.  Denies any nipple discharge.  HPI  Past Medical History:  Diagnosis Date  . Medical history non-contributory   . No pertinent past medical history     There are no problems to display for this patient.   Past Surgical History:  Procedure Laterality Date  . DILATION AND CURETTAGE OF UTERUS    . INDUCED ABORTION      OB History    Gravida  5   Para  1   Term  1   Preterm  0   AB  4   Living  1     SAB  3   TAB  1   Ectopic  0   Multiple  0   Live Births  1            Home Medications    Prior to Admission medications   Medication Sig Start Date End Date Taking? Authorizing Provider  acetaminophen (TYLENOL) 500 MG tablet Take 500 mg by mouth every 6  (six) hours as needed.   Yes [provider]  naproxen (NAPROSYN) 500 MG tablet Take 1 tablet (500 mg total) by mouth 2 (two) times daily. 01/25/20   ,  C, PA-C  pantoprazole (PROTONIX) 20 MG tablet Take 1 tablet (20 mg total) by mouth daily. 06/21/19 01/25/20  Merrilee Jansky, MD    Family History Family History  Problem Relation Age of Onset  . Parkinsonism Other   . Diabetes Other   . Schizophrenia Other   . Anesthesia problems Neg Hx     Social History Social History   Tobacco Use  . Smoking status: Never Smoker  . Smokeless tobacco: Never Used  Substance Use Topics  . Alcohol use: Yes    Comment: on weekends/ stopped with knowledge of pregnancy   . Drug use: No     Allergies   Penicillins and Penicillamine   Review of Systems Review of Systems  Constitutional: Negative for fatigue and fever.  Eyes: Negative for visual disturbance.  Respiratory: Negative for shortness of breath.   Cardiovascular: Negative for chest pain.  Gastrointestinal: Negative for abdominal pain, nausea and vomiting.  Musculoskeletal:  Positive for arthralgias and gait problem. Negative for joint swelling.  Skin: Negative for color change, rash and wound.  Neurological: Negative for dizziness, weakness, light-headedness and headaches.     Physical Exam Triage Vital Signs ED Triage Vitals  Enc Vitals Group     BP 01/25/20 1745 116/79     Pulse Rate 01/25/20 1745 93     Resp 01/25/20 1745 18     Temp 01/25/20 1745 98.8 F (37.1 C)     Temp Source 01/25/20 1745 Oral     SpO2 01/25/20 1745 99 %     Weight --      Height --      Head Circumference --      Peak Flow --      Pain Score 01/25/20 1742 4     Pain Loc --      Pain Edu? --      Excl. in GC? --    No data found.  Updated Vital Signs BP 116/79 (BP Location: Right Arm)   Pulse 93   Temp 98.8 F (37.1 C) (Oral)   Resp 18   LMP 01/17/2020 (Exact Date)   SpO2 99%   Visual Acuity Right Eye  Distance:   Left Eye Distance:   Bilateral Distance:    Right Eye Near:   Left Eye Near:    Bilateral Near:     Physical Exam Vitals and nursing note reviewed.  Constitutional:      General: She is not in acute distress.    Appearance: She is well-developed.  HENT:     Head: Normocephalic and atraumatic.  Eyes:     Conjunctiva/sclera: Conjunctivae normal.  Cardiovascular:     Rate and Rhythm: Normal rate and regular rhythm.     Heart sounds: No murmur.  Pulmonary:     Effort: Pulmonary effort is normal. No respiratory distress.     Breath sounds: Normal breath sounds.  Chest:       Comments: No discoloration or irregularities noted to skin, no palpable masses or deformities within breast tissue, does have tenderness to approximately 5 o'clock position as well as 9 o'clock position, no nipple discharge Abdominal:     Palpations: Abdomen is soft.     Tenderness: There is no abdominal tenderness.  Musculoskeletal:     Cervical back: Neck supple.     Comments: Right knee: No obvious swelling deformity or discoloration, nontender to palpation over patella, medial lateral joint line, mild tenderness to palpation to popliteal area, nontender extending into calf, no overlying calf swelling or erythema, full active range of motion of knee No varus or valgus laxity appreciated, negative Lachman's, negative anterior and posterior drawer  Skin:    General: Skin is warm and dry.  Neurological:     Mental Status: She is alert.      UC Treatments / Results  Labs (all labs ordered are listed, but only abnormal results are displayed) Labs Reviewed - No data to display  EKG   Radiology DG Knee AP/LAT W/Sunrise Left  Result Date: 01/25/2020 CLINICAL DATA:  Right knee pain for 2 years which has recently worsened. No known injury. EXAM: LEFT KNEE 3 VIEWS COMPARISON:  None. FINDINGS: No evidence of fracture, dislocation, or joint effusion. No evidence of arthropathy or other focal bone  abnormality. Soft tissues are unremarkable. IMPRESSION: Normal exam. Electronically Signed   By: Drusilla Kanner M.D.   On: 01/25/2020 18:23    Procedures Procedures (including critical care time)  Medications Ordered in UC Medications - No data to display  Initial Impression / Assessment and Plan / UC Course  I have reviewed the triage vital signs and the nursing notes.  Pertinent labs & imaging results that were available during my care of the patient were reviewed by me and considered in my medical decision making (see chart for details).    1.  Breast pain Unclear cause of breast pain, may be largely hormonal related to menstrual cycles, but given length of symptoms without any periods of relief will refer to breast center for further evaluation to rule out any underlying abnormalities causing pain.  Warm compresses and Naprosyn for pain.  2.  Left knee pain X-ray negative for acute bony abnormality, cannot rule out underlying soft tissue/ligamentous injury, but knee does feel stable.  At this time will provide Ace wrap, and recommend ice, elevation, rest and anti-inflammatories.  Follow-up with sports medicine if persisting.  Discussed strict return precautions. Patient verbalized understanding and is agreeable with plan.  Final Clinical Impressions(s) / UC Diagnoses   Final diagnoses:  Breast pain, left  Chronic pain of left knee     Discharge Instructions     Please use Naprosyn twice daily for breast pain and knee pain Apply warm compresses to breast Follow-up with breast center-they should call you with appointment time  Ice and elevate knee Rest if possible May use Ace wrap for comfort Follow-up with sports medicine if knee pain persisting   ED Prescriptions    Medication Sig Dispense Auth. Provider   naproxen (NAPROSYN) 500 MG tablet Take 1 tablet (500 mg total) by mouth 2 (two) times daily. 30 tablet , [place] C, PA-C     PDMP not reviewed this  encounter.   Janith Lima, [place] 01/26/20 [phone]

## 2020-01-29 ENCOUNTER — Other Ambulatory Visit: Payer: Self-pay | Admitting: Emergency Medicine

## 2020-01-29 DIAGNOSIS — N644 Mastodynia: Secondary | ICD-10-CM

## 2020-02-11 ENCOUNTER — Ambulatory Visit: Payer: Self-pay | Admitting: Nurse Practitioner

## 2020-07-21 ENCOUNTER — Encounter (HOSPITAL_COMMUNITY): Payer: Self-pay

## 2020-07-21 ENCOUNTER — Ambulatory Visit (HOSPITAL_COMMUNITY)
Admission: EM | Admit: 2020-07-21 | Discharge: 2020-07-21 | Disposition: A | Discharge status: Home / Self Care | Payer: Medicaid Other | Attending: Family Medicine | Admitting: Family Medicine

## 2020-07-21 ENCOUNTER — Other Ambulatory Visit: Payer: Self-pay

## 2020-07-21 ENCOUNTER — Ambulatory Visit (INDEPENDENT_AMBULATORY_CARE_PROVIDER_SITE_OTHER): Payer: Self-pay

## 2020-07-21 DIAGNOSIS — Z3202 Encounter for pregnancy test, result negative: Secondary | ICD-10-CM

## 2020-07-21 DIAGNOSIS — R0602 Shortness of breath: Secondary | ICD-10-CM

## 2020-07-21 DIAGNOSIS — J029 Acute pharyngitis, unspecified: Secondary | ICD-10-CM | POA: Insufficient documentation

## 2020-07-21 DIAGNOSIS — R5383 Other fatigue: Secondary | ICD-10-CM | POA: Insufficient documentation

## 2020-07-21 DIAGNOSIS — R05 Cough: Secondary | ICD-10-CM | POA: Insufficient documentation

## 2020-07-21 DIAGNOSIS — Z79899 Other long term (current) drug therapy: Secondary | ICD-10-CM | POA: Insufficient documentation

## 2020-07-21 DIAGNOSIS — J069 Acute upper respiratory infection, unspecified: Secondary | ICD-10-CM | POA: Insufficient documentation

## 2020-07-21 DIAGNOSIS — R110 Nausea: Secondary | ICD-10-CM | POA: Insufficient documentation

## 2020-07-21 DIAGNOSIS — Z20822 Contact with and (suspected) exposure to covid-19: Secondary | ICD-10-CM | POA: Insufficient documentation

## 2020-07-21 LAB — CBC WITH DIFFERENTIAL/PLATELET
Abs Immature Granulocytes: 0.02 10*3/uL (ref 0.00–0.07)
Basophils Absolute: 0 10*3/uL (ref 0.0–0.1)
Basophils Relative: 0 %
Eosinophils Absolute: 0.1 10*3/uL (ref 0.0–0.5)
Eosinophils Relative: 2 %
HCT: 41.9 % (ref 36.0–46.0)
Hemoglobin: 14.1 g/dL (ref 12.0–15.0)
Immature Granulocytes: 1 %
Lymphocytes Relative: 47 %
Lymphs Abs: 2.1 10*3/uL (ref 0.7–4.0)
MCH: 30.8 pg (ref 26.0–34.0)
MCHC: 33.7 g/dL (ref 30.0–36.0)
MCV: 91.5 fL (ref 80.0–100.0)
Monocytes Absolute: 0.3 10*3/uL (ref 0.1–1.0)
Monocytes Relative: 8 %
Neutro Abs: 1.8 10*3/uL (ref 1.7–7.7)
Neutrophils Relative %: 42 %
Platelets: 308 10*3/uL (ref 150–400)
RBC: 4.58 MIL/uL (ref 3.87–5.11)
RDW: 11.6 % (ref 11.5–15.5)
WBC: 4.4 10*3/uL (ref 4.0–10.5)
nRBC: 0 % (ref 0.0–0.2)

## 2020-07-21 LAB — BASIC METABOLIC PANEL
Anion gap: 9 (ref 5–15)
BUN: 9 mg/dL (ref 6–20)
CO2: 25 mmol/L (ref 22–32)
Calcium: 9.6 mg/dL (ref 8.9–10.3)
Chloride: 103 mmol/L (ref 98–111)
Creatinine, Ser: 0.83 mg/dL (ref 0.44–1.00)
GFR calc Af Amer: >60 mL/min (ref >60)
GFR calc non Af Amer: >60 mL/min (ref >60)
Glucose, Bld: 89 mg/dL (ref 70–99)
Potassium: 3.8 mmol/L (ref 3.5–5.1)
Sodium: 137 mmol/L (ref 135–145)

## 2020-07-21 LAB — POC URINE PREG, ED: Preg Test, Ur: NEGATIVE

## 2020-07-21 LAB — SARS CORONAVIRUS 2 (TAT 6-24 HRS): SARS Coronavirus 2: NEGATIVE

## 2020-07-21 MED ORDER — ALBUTEROL SULFATE HFA 108 (90 BASE) MCG/ACT IN AERS: 1.0000 | INHALATION_SPRAY | Freq: Four times a day (QID) | RESPIRATORY_TRACT | 0 refills | Status: AC | PRN

## 2020-07-21 NOTE — Discharge Instructions (Addendum)
Your chest xray is normal tonight which is reassuring.  Self isolate until covid results are back and negative.  Will notify you by phone of any positive findings. Your negative results will be sent through your MyChart.     Push fluids to ensure adequate hydration and keep secretions thin.  Tylenol and/or ibuprofen as needed for pain or fevers.  Use of inhaler as needed for wheezing or chest tightness.  I have collected some basic labs in consideration of your fatigue- I would call you if there are any concerning findings. Illness can certainly cause residual fatigue which will improve over time.  Please follow up with a primary care provider for recheck, especially if persistent symptoms without improvement.

## 2020-07-21 NOTE — ED Provider Notes (Signed)
[facility]    CSN: [id number] Arrival date & time: 07/21/20  1524      History   Chief Complaint Chief Complaint  Patient presents with   Sore Throat   Fatigue    HPI Madison [name] is a 34 y.o. female.   Madison [name] presents with complaints of symptoms which started 1 week ago with sore throat,  "bad cold" with cough, and congestion. Was watching a baby who had a "sinus infection."  Feels very fatigued. Headache. Feels like she is "not getting enough air."  Nausea. Feels very hot. Mucus and congestion have improved. Used her boyfriend's  inhaler yesterday to help with breathing. Cough is productive. No vomiting or diarrhea. Entire household has had similar illness, they were tested for covid and negative. No history of covid-19 and has not received vaccination. Was taking nyquil for cold symptoms. No known fevers. Denies any previous similar.   ROS per HPI, negative if not otherwise mentioned.      Past Medical History:  Diagnosis Date   Medical history non-contributory    No pertinent past medical history     There are no problems to display for this patient.   Past Surgical History:  Procedure Laterality Date   DILATION AND CURETTAGE OF UTERUS     INDUCED ABORTION      OB History    Gravida  5   Para  1   Term  1   Preterm  0   AB  4   Living  1     SAB  3   TAB  1   Ectopic  0   Multiple  0   Live Births  1            Home Medications    Prior to Admission medications   Medication Sig Start Date End Date Taking? Authorizing Provider  acetaminophen (TYLENOL) 500 MG tablet Take 500 mg by mouth every 6 (six) hours as needed.    [provider]  albuterol (PROAIR HFA) 108 (90 Base) MCG/ACT inhaler Inhale 1-2 puffs into the lungs every 6 (six) hours as needed for wheezing or shortness of breath. 07/21/20   Georgetta Haber, NP  naproxen (NAPROSYN) 500 MG tablet Take 1 tablet (500 mg total) by mouth 2  (two) times daily. 01/25/20   Wieters, Hallie C, PA-C  pantoprazole (PROTONIX) 20 MG tablet Take 1 tablet (20 mg total) by mouth daily. 06/21/19 01/25/20  LampteyBritta Mccreedy, MD    Family History Family History  Problem Relation Age of Onset   Parkinsonism Other    Diabetes Other    Schizophrenia Other    Anesthesia problems Neg Hx     Social History Social History   Tobacco Use   Smoking status: Never Smoker   Smokeless tobacco: Never Used  Substance Use Topics   Alcohol use: Yes    Comment: on weekends/ stopped with knowledge of pregnancy    Drug use: No     Allergies   Penicillins and Penicillamine   Review of Systems Review of Systems   Physical Exam Triage Vital Signs ED Triage Vitals [07/21/20 1705]  Enc Vitals Group     BP 124/89     Pulse Rate 68     Resp 18     Temp 98 F (36.7 C)     Temp src      SpO2 100 %     Weight  Height      Head Circumference      Peak Flow      Pain Score      Pain Loc      Pain Edu?      Excl. in GC?    No data found.  Updated Vital Signs BP 124/89    Pulse 68    Temp 98 F (36.7 C)    Resp 18    LMP 07/06/2020    SpO2 100%   Visual Acuity Right Eye Distance:   Left Eye Distance:   Bilateral Distance:    Right Eye Near:   Left Eye Near:    Bilateral Near:     Physical Exam Constitutional:      General: She is not in acute distress.    Appearance: She is well-developed.  HENT:     Mouth/Throat:     Tonsils: No tonsillar exudate.  Cardiovascular:     Rate and Rhythm: Normal rate.  Pulmonary:     Effort: Pulmonary effort is normal.     Breath sounds: Normal breath sounds.  Skin:    General: Skin is warm and dry.  Neurological:     Mental Status: She is alert and oriented to person, place, and time.      UC Treatments / Results  Labs (all labs ordered are listed, but only abnormal results are displayed) Labs Reviewed  SARS CORONAVIRUS 2 (TAT 6-24 HRS)  CBC WITH DIFFERENTIAL/PLATELET    BASIC METABOLIC PANEL  POC URINE PREG, ED    EKG   Radiology DG Chest 2 View  Result Date: 07/21/2020 CLINICAL DATA:  Dyspnea, productive cough for 3 days EXAM: CHEST - 2 VIEW COMPARISON:  None. FINDINGS: Normal heart size. Normal mediastinal contour. No pneumothorax. No pleural effusion. Lungs appear clear, with no acute consolidative airspace disease and no pulmonary edema. IMPRESSION: No active cardiopulmonary disease. Electronically Signed   By: Delbert Phenix M.D.   On: 07/21/2020 18:50    Procedures Procedures (including critical care time)  Medications Ordered in UC Medications - No data to display  Initial Impression / Assessment and Plan / UC Course  I have reviewed the triage vital signs and the nursing notes.  Pertinent labs & imaging results that were available during my care of the patient were reviewed by me and considered in my medical decision making (see chart for details).     Non toxic. Benign physical exam.  Chest xray and basic labs normal today. Suspect viral illness, still with residual fatigue. Supportive cares recommended. Return precautions provided. Patient verbalized understanding and agreeable to plan.   Final Clinical Impressions(s) / UC Diagnoses   Final diagnoses:  Fatigue, unspecified type  Acute upper respiratory infection     Discharge Instructions     Your chest xray is normal tonight which is reassuring.  Self isolate until covid results are back and negative.  Will notify you by phone of any positive findings. Your negative results will be sent through your MyChart.     Push fluids to ensure adequate hydration and keep secretions thin.  Tylenol and/or ibuprofen as needed for pain or fevers.  Use of inhaler as needed for wheezing or chest tightness.  I have collected some basic labs in consideration of your fatigue- I would call you if there are any concerning findings. Illness can certainly cause residual fatigue which will improve  over time.  Please follow up with a primary care provider for recheck, especially if persistent  symptoms without improvement.       ED Prescriptions    Medication Sig Dispense Auth. Provider   albuterol (PROAIR HFA) 108 (90 Base) MCG/ACT inhaler Inhale 1-2 puffs into the lungs every 6 (six) hours as needed for wheezing or shortness of breath. 8 g Georgetta Haber, NP     PDMP not reviewed this encounter.   Georgetta Haber, NP 07/22/20 1325

## 2020-07-21 NOTE — ED Triage Notes (Signed)
Sore throat and cough x 1 week, with increasing fatigue and now shortness of breath x 3 days.  Pt has not been tested for COVID, not vaccinated

## 2021-02-25 ENCOUNTER — Encounter (HOSPITAL_COMMUNITY): Payer: Self-pay | Admitting: *Deleted

## 2021-02-25 ENCOUNTER — Emergency Department (HOSPITAL_COMMUNITY)
Admission: EM | Admit: 2021-02-25 | Discharge: 2021-02-26 | Disposition: A | Discharge status: Home / Self Care | Payer: Self-pay | Attending: Emergency Medicine | Admitting: Emergency Medicine

## 2021-02-25 DIAGNOSIS — N390 Urinary tract infection, site not specified: Secondary | ICD-10-CM | POA: Insufficient documentation

## 2021-02-25 DIAGNOSIS — M545 Low back pain, unspecified: Secondary | ICD-10-CM | POA: Insufficient documentation

## 2021-02-25 DIAGNOSIS — E871 Hypo-osmolality and hyponatremia: Secondary | ICD-10-CM | POA: Diagnosis not present

## 2021-02-25 DIAGNOSIS — R110 Nausea: Secondary | ICD-10-CM

## 2021-02-25 NOTE — ED Triage Notes (Signed)
Pt reports onset of chills tonight. Has been having UTI symptoms (dysuria, abd/flank pain)  for 2 weeks and taking AZO without relief. Also reports NV

## 2021-02-25 NOTE — ED Provider Notes (Incomplete)
[facility] EMERGENCY DEPARTMENT Provider Note   CSN: [id number] Arrival date & time: 02/25/21  2319   History Chief Complaint  Patient presents with  . Emesis    Madison [name] is a 35 y.o. female.  The history is provided by the patient.  Emesis   Past Medical History:  Diagnosis Date  . Medical history non-contributory   . No pertinent past medical history     There are no problems to display for this patient.   Past Surgical History:  Procedure Laterality Date  . DILATION AND CURETTAGE OF UTERUS    . INDUCED ABORTION       OB History    Gravida  5   Para  1   Term  1   Preterm  0   AB  4   Living  1     SAB  3   IAB  1   Ectopic  0   Multiple  0   Live Births  1           Family History  Problem Relation Age of Onset  . Parkinsonism Other   . Diabetes Other   . Schizophrenia Other   . Anesthesia problems Neg Hx     Social History   Tobacco Use  . Smoking status: Never Smoker  . Smokeless tobacco: Never Used  Substance Use Topics  . Alcohol use: Yes    Comment: on weekends/ stopped with knowledge of pregnancy   . Drug use: No    Home Medications Prior to Admission medications   Medication Sig Start Date End Date Taking? Authorizing Provider  acetaminophen (TYLENOL) 500 MG tablet Take 500 mg by mouth every 6 (six) hours as needed.    [provider]  albuterol (PROAIR HFA) 108 (90 Base) MCG/ACT inhaler Inhale 1-2 puffs into the lungs every 6 (six) hours as needed for wheezing or shortness of breath. 07/21/20   Georgetta Haber, NP  naproxen (NAPROSYN) 500 MG tablet Take 1 tablet (500 mg total) by mouth 2 (two) times daily. 01/25/20   Wieters, Hallie C, PA-C  pantoprazole (PROTONIX) 20 MG tablet Take 1 tablet (20 mg total) by mouth daily. 06/21/19 01/25/20  Merrilee Jansky, MD    Allergies    Penicillins and Penicillamine  Review of Systems   Review of Systems  Gastrointestinal: Positive for  vomiting.  All other systems reviewed and are negative.   Physical Exam Updated Vital Signs BP 126/73 (BP Location: Left Arm)   Pulse (!) 108   Temp (!) 100.7 F (38.2 C) (Oral)   Resp 17   LMP 02/02/2021   SpO2 100%   Physical Exam Vitals and nursing note reviewed.   *** year old ***female, resting comfortably and in no acute distress. Vital signs are ***. Oxygen saturation is ***%, which is normal. Head is normocephalic and atraumatic. PERRLA, EOMI. Oropharynx is clear. Neck is nontender and supple without adenopathy or JVD. Back is nontender and there is no CVA tenderness. Lungs are clear without rales, wheezes, or rhonchi. Chest is nontender. Heart has regular rate and rhythm without murmur. Abdomen is soft, flat, nontender without masses or hepatosplenomegaly and peristalsis is normoactive. Extremities have no cyanosis or edema, full range of motion is present. Skin is warm and dry without rash. Neurologic: Mental status is normal, cranial nerves are intact, there are no motor or sensory deficits.  ED Results / Procedures / Treatments   Labs (all labs ordered are  listed, but only abnormal results are displayed) Labs Reviewed  COMPREHENSIVE METABOLIC PANEL  CBC  URINALYSIS, ROUTINE W REFLEX MICROSCOPIC  I-STAT BETA HCG BLOOD, ED ([facility], [facility], [facility] ONLY)    EKG None  Radiology No results found.  Procedures Procedures {Remember to document critical care time when appropriate:1}  Medications Ordered in ED Medications - No data to display  ED Course  I have reviewed the triage vital signs and the nursing notes.  Pertinent labs & imaging results that were available during my care of the patient were reviewed by me and considered in my medical decision making (see chart for details).    MDM Rules/Calculators/A&P                          *** Final Clinical Impression(s) / ED Diagnoses Final diagnoses:  None    Rx / DC Orders ED Discharge Orders    None

## 2021-02-25 NOTE — ED Provider Notes (Signed)
[facility] EMERGENCY DEPARTMENT Provider Note   CSN: [id number] Arrival date & time: 02/25/21  2319   History Chief Complaint  Patient presents with  . Emesis    Madison [name] is a 35 y.o. female.  The history is provided by the patient.  Emesis She states that she thinks she has had a urinary tract infection for the last week.  She has had cloudy urine which was malodorous and she had urinary frequency and dysuria.  She tried drinking a lot of fluids and taking phenazopyridine, but was not getting any better.  She has also been having pain in her upper and lower abdomen.  There has been nausea but no vomiting.  Today, she started developing chills.  She was not aware of a fever and she has not broken out in sweats.  She has had some pain in her lower back as well.  She states she has had difficulties with urinary tract infections in the past.  Past Medical History:  Diagnosis Date  . Medical history non-contributory   . No pertinent past medical history     There are no problems to display for this patient.   Past Surgical History:  Procedure Laterality Date  . DILATION AND CURETTAGE OF UTERUS    . INDUCED ABORTION       OB History    Gravida  5   Para  1   Term  1   Preterm  0   AB  4   Living  1     SAB  3   IAB  1   Ectopic  0   Multiple  0   Live Births  1           Family History  Problem Relation Age of Onset  . Parkinsonism Other   . Diabetes Other   . Schizophrenia Other   . Anesthesia problems Neg Hx     Social History   Tobacco Use  . Smoking status: Never Smoker  . Smokeless tobacco: Never Used  Substance Use Topics  . Alcohol use: Yes    Comment: on weekends/ stopped with knowledge of pregnancy   . Drug use: No    Home Medications Prior to Admission medications   Medication Sig Start Date End Date Taking? Authorizing Provider  acetaminophen (TYLENOL) 500 MG tablet Take 500 mg by mouth every 6  (six) hours as needed.    [provider]  albuterol (PROAIR HFA) 108 (90 Base) MCG/ACT inhaler Inhale 1-2 puffs into the lungs every 6 (six) hours as needed for wheezing or shortness of breath. 07/21/20   Georgetta Haber, NP  naproxen (NAPROSYN) 500 MG tablet Take 1 tablet (500 mg total) by mouth 2 (two) times daily. 01/25/20   Wieters, Hallie C, PA-C  pantoprazole (PROTONIX) 20 MG tablet Take 1 tablet (20 mg total) by mouth daily. 06/21/19 01/25/20  Merrilee Jansky, MD    Allergies    Penicillins and Penicillamine  Review of Systems   Review of Systems  Gastrointestinal: Positive for vomiting.  All other systems reviewed and are negative.   Physical Exam Updated Vital Signs BP 126/73 (BP Location: Left Arm)   Pulse (!) 108   Temp (!) 100.7 F (38.2 C) (Oral)   Resp 17   LMP 02/02/2021   SpO2 100%   Physical Exam Vitals and nursing note reviewed.   35 year old female, resting comfortably and in no acute distress. Vital signs are  significant for elevated temperature and heart rate. Oxygen saturation is 100%, which is normal. Head is normocephalic and atraumatic. PERRLA, EOMI. Oropharynx is clear. Neck is nontender and supple without adenopathy or JVD. Back is nontender and there is no CVA tenderness. Lungs are clear without rales, wheezes, or rhonchi. Chest is nontender. Heart has regular rate and rhythm without murmur. Abdomen is soft, flat, with mild suprapubic tenderness.  There is no rebound or guarding.  There are no masses or hepatosplenomegaly and peristalsis is hypoactive. Extremities have no cyanosis or edema, full range of motion is present. Skin is warm and dry without rash. Neurologic: Mental status is normal, cranial nerves are intact, there are no motor or sensory deficits.  ED Results / Procedures / Treatments   Labs (all labs ordered are listed, but only abnormal results are displayed) Labs Reviewed  COMPREHENSIVE METABOLIC PANEL - Abnormal; Notable  for the following components:      Result Value   Sodium 134 (*)    Glucose, Bld 121 (*)    Calcium 8.5 (*)    All other components within normal limits  URINALYSIS, ROUTINE W REFLEX MICROSCOPIC - Abnormal; Notable for the following components:   Hgb urine dipstick SMALL (*)    Leukocytes,Ua TRACE (*)    All other components within normal limits  URINE CULTURE  CULTURE, BLOOD (ROUTINE X 2)  CULTURE, BLOOD (ROUTINE X 2)  CBC  LACTIC ACID, PLASMA  PROTIME-INR  APTT  LACTIC ACID, PLASMA  I-STAT BETA HCG BLOOD, ED ([facility], [facility], [facility] ONLY)    EKG EKG Interpretation  Date/Time:  Thursday February 26 2021 00:40:54 EDT Ventricular Rate:  99 PR Interval:    QRS Duration: 73 QT Interval:  346 QTC Calculation: 444 R Axis:   69 Text Interpretation: Sinus rhythm Borderline T abnormalities, diffuse leads No old tracing to compare Confirmed by Dione Booze ([id number]) on 02/26/2021 1:12:42 AM   Radiology DG Chest Port 1 View  Result Date: 02/26/2021 CLINICAL DATA:  Questionable sepsis, chills, UTI symptoms for 2 weeks, azithromycin without relief EXAM: PORTABLE CHEST 1 VIEW COMPARISON:  07/21/2020 FINDINGS: No consolidation, features of edema, pneumothorax, or effusion. Pulmonary vascularity is normally distributed. The cardiomediastinal contours are unremarkable. No acute osseous or soft tissue abnormality. IMPRESSION: No acute cardiopulmonary abnormality. Electronically Signed   By: Kreg Shropshire M.D.   On: 02/26/2021 00:28    Procedures Procedures   Medications Ordered in ED Medications - No data to display  ED Course  I have reviewed the triage vital signs and the nursing notes.  Pertinent labs & imaging results that were available during my care of the patient were reviewed by me and considered in my medical decision making (see chart for details).  MDM Rules/Calculators/A&P Symptoms of urinary tract infection with vital signs that are possibly indicative of sepsis.  Patient is started on  the evolving sepsis pathway.  Blood and urine cultures are obtained and she is given some IV fluid as well as ondansetron and acetaminophen.  Old records were reviewed, and she does have prior ED visits for urinary tract infections.  Labs are unremarkable.  Sodium is slightly low at 134, not clinically significant.  WBC is normal.  Urinalysis has 11-20 WBCs.  Lactic acid level is normal.  Chest x-ray is normal.  ECG shows minor nonspecific T wave changes. She feels somewhat better after above-noted treatment.  She will be given dose of ceftriaxone, discharged with prescription for cephalexin.  Final Clinical Impression(s) / ED Diagnoses  Final diagnoses:  Urinary tract infection without hematuria, site unspecified  Hyponatremia  Nausea    Rx / DC Orders ED Discharge Orders         Ordered    cephALEXin (KEFLEX) 500 MG capsule  3 times daily        02/26/21 0310    ondansetron (ZOFRAN) 4 MG tablet  Every 6 hours PRN        02/26/21 0310           Dione Booze, MD 02/26/21 [phone]

## 2021-02-26 ENCOUNTER — Emergency Department (HOSPITAL_COMMUNITY): Payer: Self-pay

## 2021-02-26 LAB — COMPREHENSIVE METABOLIC PANEL
ALT: 17 U/L (ref 0–44)
AST: 20 U/L (ref 15–41)
Albumin: 3.6 g/dL (ref 3.5–5.0)
Alkaline Phosphatase: 57 U/L (ref 38–126)
Anion gap: 6 (ref 5–15)
BUN: 13 mg/dL (ref 6–20)
CO2: 23 mmol/L (ref 22–32)
Calcium: 8.5 mg/dL — ABNORMAL LOW (ref 8.9–10.3)
Chloride: 105 mmol/L (ref 98–111)
Creatinine, Ser: 1 mg/dL (ref 0.44–1.00)
GFR, Estimated: >60 mL/min (ref >60)
Glucose, Bld: 121 mg/dL — ABNORMAL HIGH (ref 70–99)
Potassium: 3.5 mmol/L (ref 3.5–5.1)
Sodium: 134 mmol/L — ABNORMAL LOW (ref 135–145)
Total Bilirubin: 0.8 mg/dL (ref 0.3–1.2)
Total Protein: 6.6 g/dL (ref 6.5–8.1)

## 2021-02-26 LAB — URINALYSIS, ROUTINE W REFLEX MICROSCOPIC
Bacteria, UA: NONE SEEN
Bilirubin Urine: NEGATIVE
Glucose, UA: NEGATIVE mg/dL
Ketones, ur: NEGATIVE mg/dL
Nitrite: NEGATIVE
Protein, ur: NEGATIVE mg/dL
Specific Gravity, Urine: 1.014 (ref 1.005–1.030)
pH: 5 (ref 5.0–8.0)

## 2021-02-26 LAB — CBC
HCT: 38.8 % (ref 36.0–46.0)
Hemoglobin: 12.9 g/dL (ref 12.0–15.0)
MCH: 32.3 pg (ref 26.0–34.0)
MCHC: 33.2 g/dL (ref 30.0–36.0)
MCV: 97 fL (ref 80.0–100.0)
Platelets: 253 10*3/uL (ref 150–400)
RBC: 4 MIL/uL (ref 3.87–5.11)
RDW: 11.9 % (ref 11.5–15.5)
WBC: 9.2 10*3/uL (ref 4.0–10.5)
nRBC: 0 % (ref 0.0–0.2)

## 2021-02-26 LAB — I-STAT BETA HCG BLOOD, ED (MC, WL, AP ONLY): I-stat hCG, quantitative: <5 m[IU]/mL (ref <5)

## 2021-02-26 LAB — APTT: aPTT: 34 seconds (ref 24–36)

## 2021-02-26 LAB — PROTIME-INR
INR: 1 (ref 0.8–1.2)
Prothrombin Time: 12.6 seconds (ref 11.4–15.2)

## 2021-02-26 LAB — LACTIC ACID, PLASMA: Lactic Acid, Venous: 1.5 mmol/L (ref 0.5–1.9)

## 2021-02-26 MED ORDER — ACETAMINOPHEN 325 MG PO TABS
650.0000 mg | ORAL_TABLET | Freq: Once | ORAL | Status: AC
  Administered 2021-02-26: 650 mg via ORAL
  Filled 2021-02-26: qty 2

## 2021-02-26 MED ORDER — ALUM & MAG HYDROXIDE-SIMETH 200-200-20 MG/5ML PO SUSP
30.0000 mL | Freq: Once | ORAL | Status: AC
  Administered 2021-02-26: 30 mL via ORAL
  Filled 2021-02-26: qty 30

## 2021-02-26 MED ORDER — PANTOPRAZOLE SODIUM 40 MG PO TBEC
40.0000 mg | DELAYED_RELEASE_TABLET | Freq: Once | ORAL | Status: AC
  Administered 2021-02-26: 40 mg via ORAL
  Filled 2021-02-26: qty 1

## 2021-02-26 MED ORDER — SODIUM CHLORIDE 0.9 % IV SOLN
2.0000 g | Freq: Once | INTRAVENOUS | Status: AC
  Administered 2021-02-26: 2 g via INTRAVENOUS
  Filled 2021-02-26: qty 20

## 2021-02-26 MED ORDER — LACTATED RINGERS IV BOLUS (SEPSIS)
1000.0000 mL | Freq: Once | INTRAVENOUS | Status: AC
  Administered 2021-02-26: 1000 mL via INTRAVENOUS

## 2021-02-26 MED ORDER — ONDANSETRON HCL 4 MG PO TABS: 4.0000 mg | ORAL_TABLET | Freq: Four times a day (QID) | ORAL | 0 refills | Status: AC | PRN

## 2021-02-26 MED ORDER — CEPHALEXIN 500 MG PO CAPS: 500.0000 mg | ORAL_CAPSULE | Freq: Three times a day (TID) | ORAL | 0 refills | Status: AC

## 2021-02-26 MED ORDER — ONDANSETRON HCL 4 MG/2ML IJ SOLN
4.0000 mg | Freq: Once | INTRAMUSCULAR | Status: AC
  Administered 2021-02-26: 4 mg via INTRAVENOUS
  Filled 2021-02-26: qty 2

## 2021-02-26 NOTE — ED Notes (Signed)
Pt verbalized understanding of d/c instructions, follow up and medications. Pt ambulatory to [facility], NAD 

## 2021-02-26 NOTE — Discharge Instructions (Signed)
Drink plenty of fluids.  Take acetaminophen and/or ibuprofen as needed for fever or pain.  Return to the emergency department if symptoms are getting worse.

## 2021-02-27 LAB — URINE CULTURE

## 2021-03-03 LAB — CULTURE, BLOOD (ROUTINE X 2)
Culture: NO GROWTH
Special Requests: ADEQUATE

## 2021-12-05 IMAGING — DX DG CHEST 2V
2 series · 2 of 2 positions shown · non-contrast
Comparison: None.

CLINICAL DATA: Dyspnea, productive cough for 3 days

EXAM:
CHEST - 2 VIEW

[chest pa]
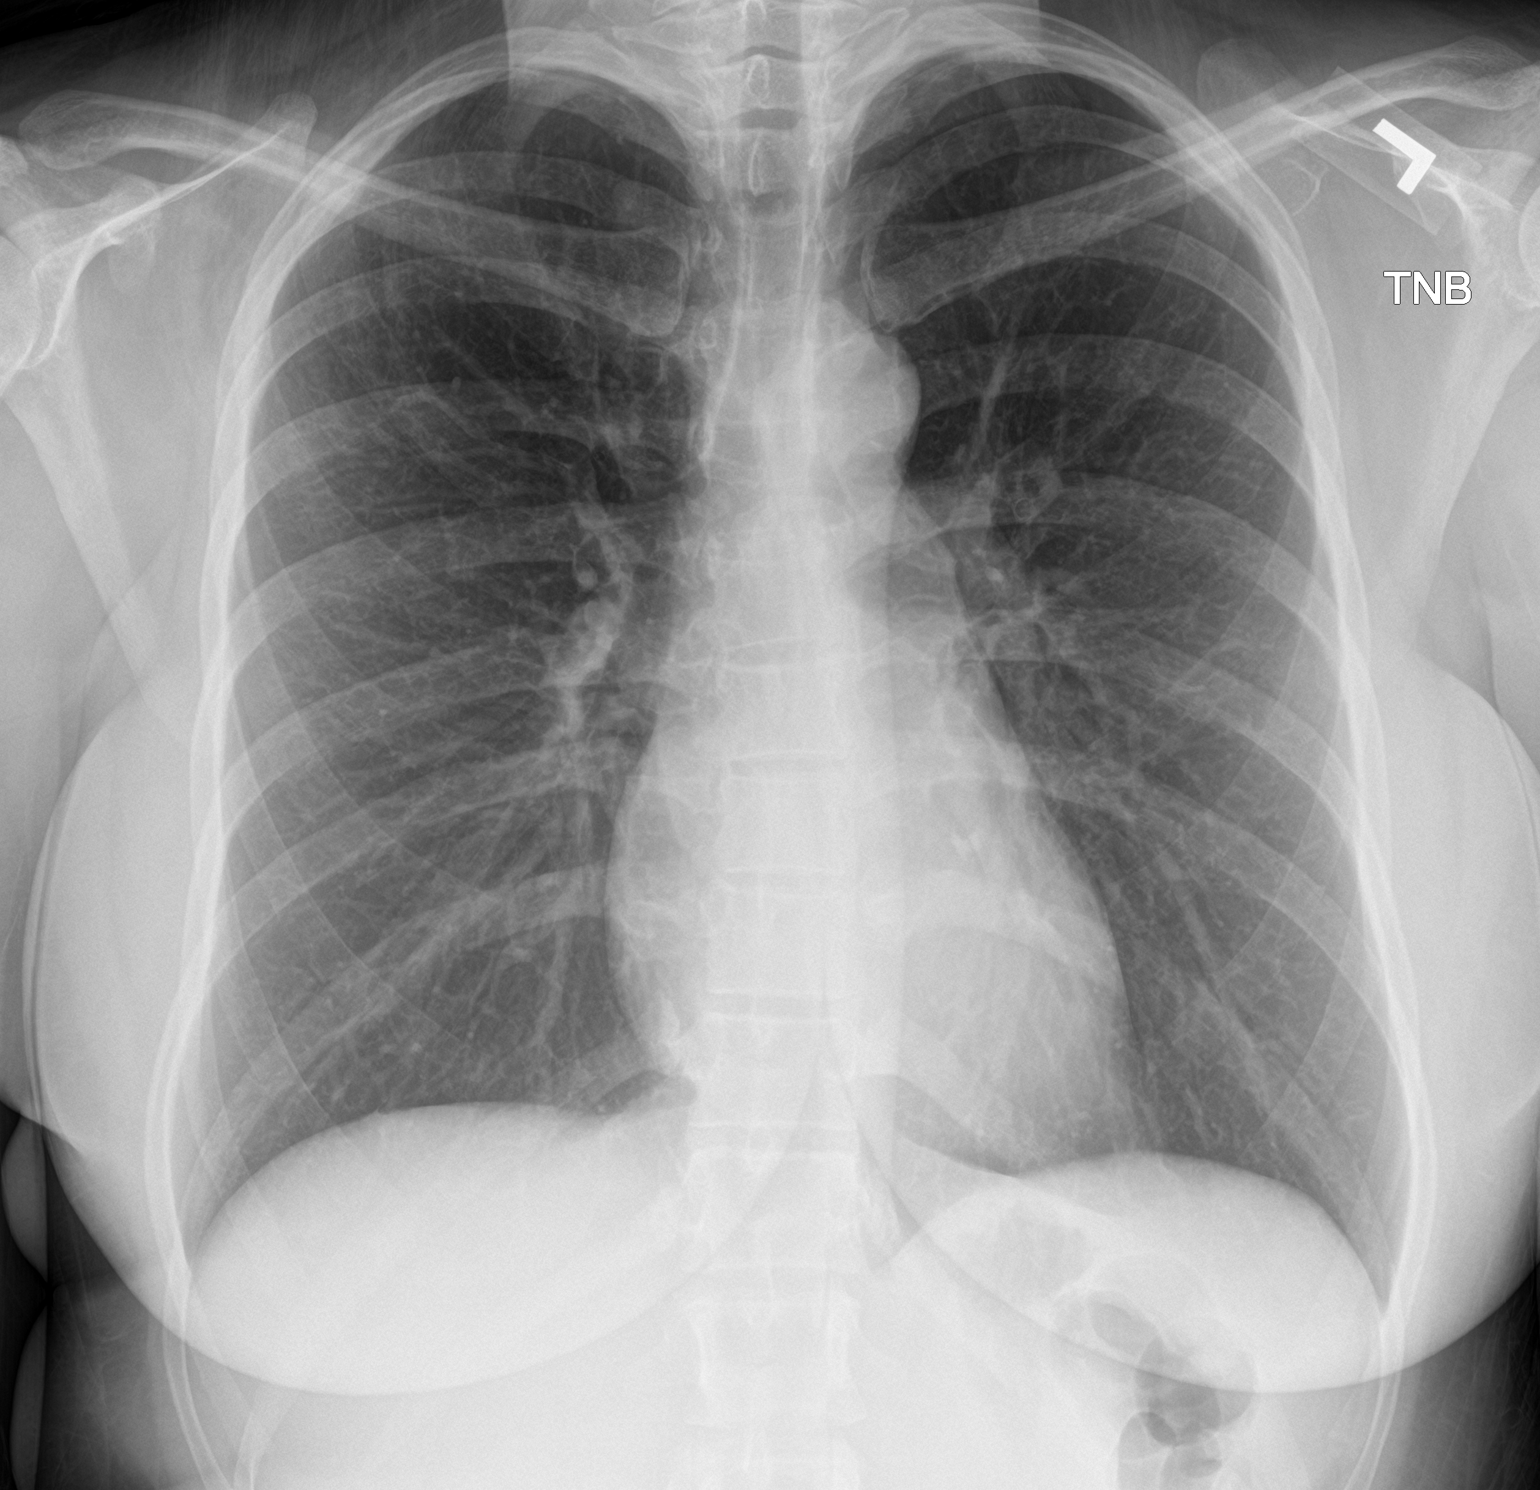

[chest lat]
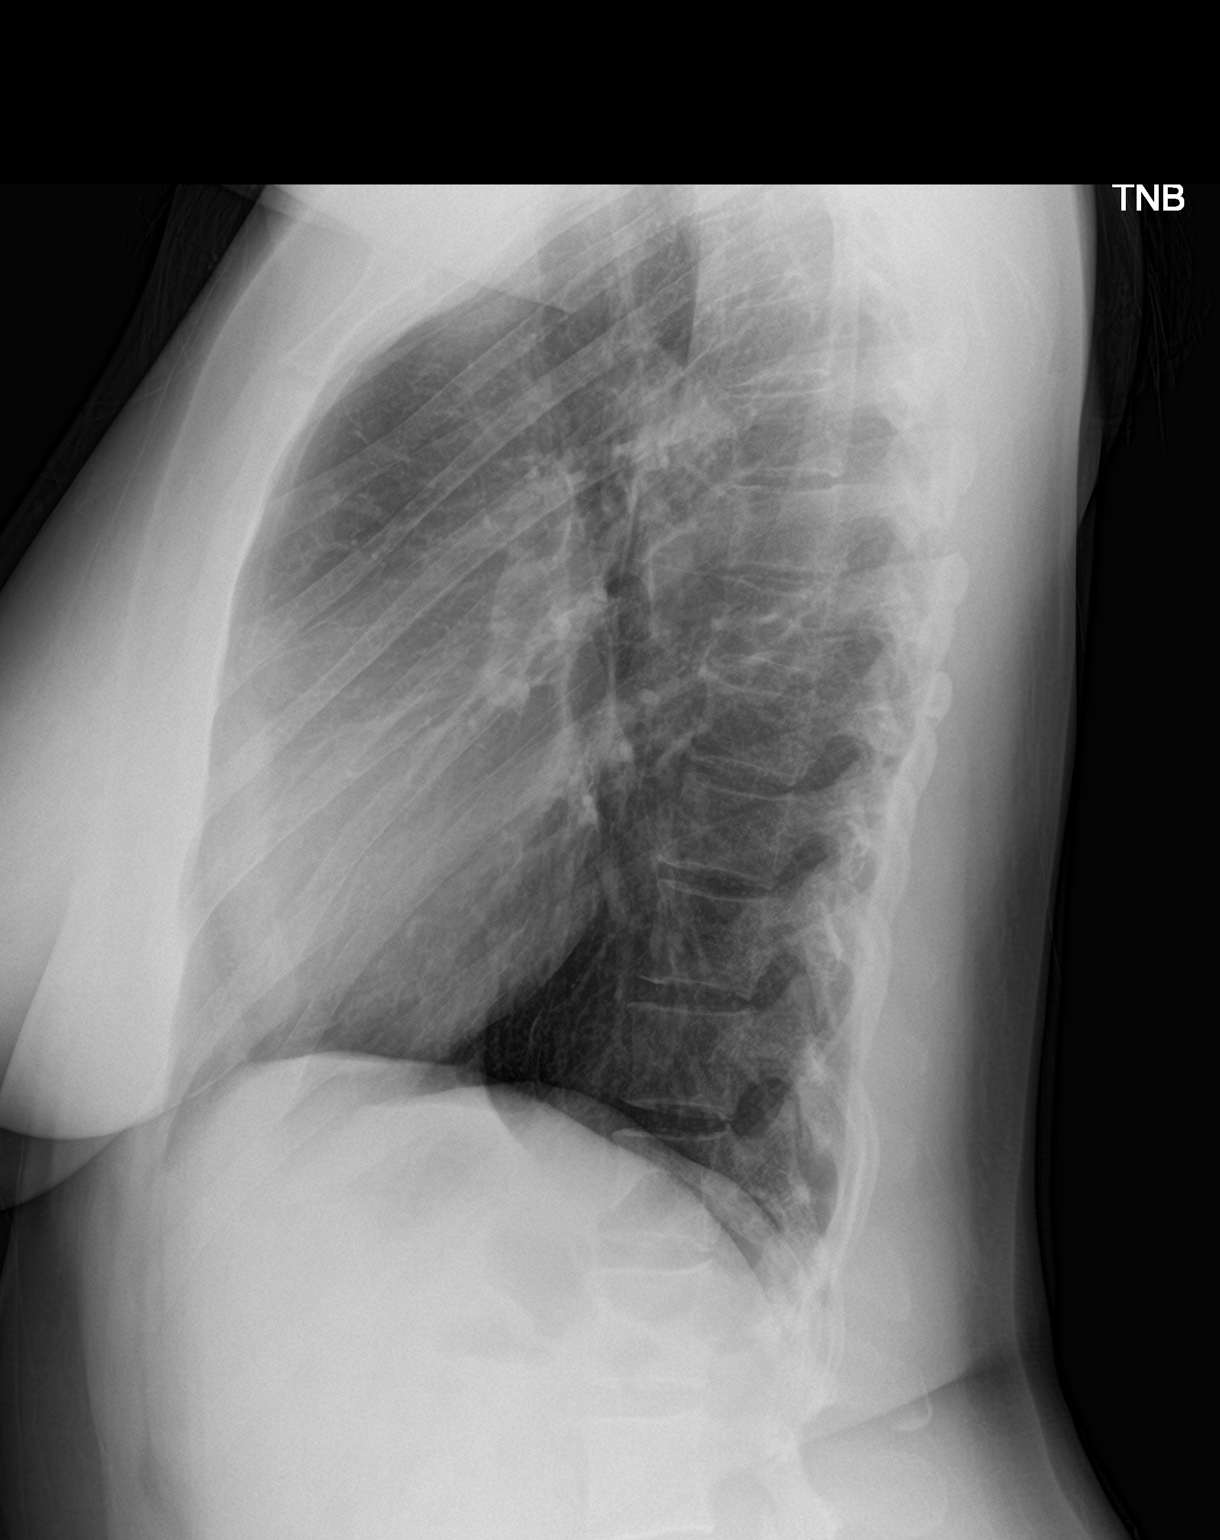

[2 of 2 positions shown; findings below may reference images not displayed]

FINDINGS: Normal heart size. Normal mediastinal contour. No pneumothorax. No
pleural effusion. Lungs appear clear, with no acute consolidative
airspace disease and no pulmonary edema.
IMPRESSION: No active cardiopulmonary disease.
